# Patient Record
Sex: Female | Born: 1950 | Race: White | Hispanic: No | Marital: Single | Smoking: Current every day smoker
Health system: Southern US, Community
[De-identification: ages and names within clinical notes are randomized; demographics above are authoritative.]

## PROBLEM LIST (undated history)

## (undated) DIAGNOSIS — T7840XA Allergy, unspecified, initial encounter: Secondary | ICD-10-CM

## (undated) DIAGNOSIS — E079 Disorder of thyroid, unspecified: Secondary | ICD-10-CM

## (undated) DIAGNOSIS — I341 Nonrheumatic mitral (valve) prolapse: Secondary | ICD-10-CM

## (undated) HISTORY — DX: Disorder of thyroid, unspecified: E07.9

## (undated) HISTORY — DX: Allergy, unspecified, initial encounter: T78.40XA

---

## 1994-03-17 HISTORY — PX: PARTIAL HYSTERECTOMY: SHX80

## 1994-03-17 HISTORY — PX: ABDOMINAL HYSTERECTOMY: SHX81

## 2003-08-16 ENCOUNTER — Encounter: Admission: RE | Admit: 2003-08-16 | Discharge: 2003-08-16 | Payer: Self-pay | Admitting: Family Medicine

## 2003-08-24 ENCOUNTER — Encounter: Admission: RE | Admit: 2003-08-24 | Discharge: 2003-08-24 | Payer: Self-pay | Admitting: Family Medicine

## 2003-10-25 ENCOUNTER — Ambulatory Visit (HOSPITAL_COMMUNITY): Admission: RE | Admit: 2003-10-25 | Discharge: 2003-10-25 | Payer: Self-pay | Admitting: *Deleted

## 2005-04-29 LAB — HM COLONOSCOPY: HM COLON: NORMAL

## 2009-05-14 ENCOUNTER — Ambulatory Visit: Payer: Self-pay | Admitting: Surgery

## 2009-05-17 ENCOUNTER — Ambulatory Visit: Payer: Self-pay | Admitting: Surgery

## 2010-04-07 ENCOUNTER — Encounter: Payer: Self-pay | Admitting: Family Medicine

## 2011-10-25 ENCOUNTER — Emergency Department: Payer: Self-pay | Admitting: Emergency Medicine

## 2011-10-25 LAB — COMPREHENSIVE METABOLIC PANEL
Albumin: 3.9 g/dL (ref 3.4–5.0)
Calcium, Total: 8.8 mg/dL (ref 8.5–10.1)
Co2: 19 mmol/L — ABNORMAL LOW (ref 21–32)
EGFR (African American): 44 — ABNORMAL LOW
Glucose: 121 mg/dL — ABNORMAL HIGH (ref 65–99)
Potassium: 4 mmol/L (ref 3.5–5.1)
SGOT(AST): 24 U/L (ref 15–37)
Total Protein: 7.8 g/dL (ref 6.4–8.2)

## 2011-10-25 LAB — CBC
MCH: 33.4 pg (ref 26.0–34.0)
MCV: 95 fL (ref 80–100)
RBC: 4.76 10*6/uL (ref 3.80–5.20)
RDW: 13.6 % (ref 11.5–14.5)

## 2011-10-25 LAB — URINALYSIS, COMPLETE
Bilirubin,UR: NEGATIVE
Leukocyte Esterase: NEGATIVE
Nitrite: NEGATIVE
RBC,UR: 122 /HPF (ref 0–5)
Squamous Epithelial: 2
WBC UR: 13 /HPF (ref 0–5)

## 2011-10-26 LAB — URINE CULTURE

## 2012-08-24 ENCOUNTER — Ambulatory Visit: Payer: Self-pay | Admitting: Family Medicine

## 2014-02-08 LAB — LIPID PANEL
Cholesterol: 202 mg/dL — AB (ref 0–200)
HDL: 58 mg/dL (ref 35–70)
LDL Cholesterol: 127 mg/dL
TRIGLYCERIDES: 86 mg/dL (ref 40–160)

## 2014-03-21 ENCOUNTER — Ambulatory Visit: Payer: Self-pay | Admitting: Family Medicine

## 2014-03-21 LAB — HM MAMMOGRAPHY: HM Mammogram: NORMAL

## 2014-08-21 IMAGING — US US BREAST BILAT
1 series · 14 of 25 positions shown · non-contrast
Comparison: none

REASON FOR EXAM: RT BR NODULE 4 OCLOCK LT BR NODULE 7-8 OCLOCK
COMMENTS:

[Series 1: us breast bilat · 0.08mm/px · 14 of 50 slices shown]
[im 1/50]
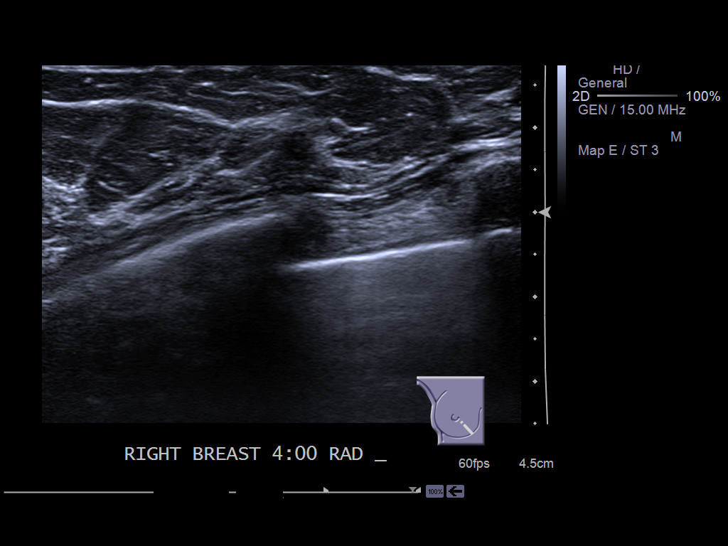
[im 5/50]
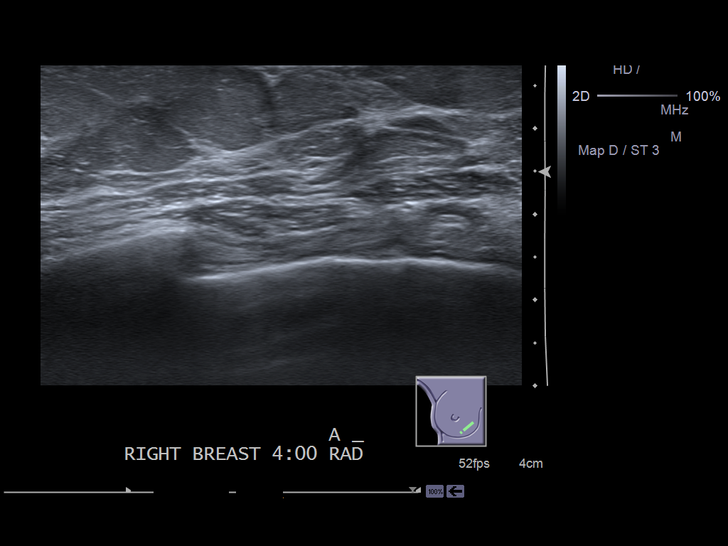
[im 9/50]
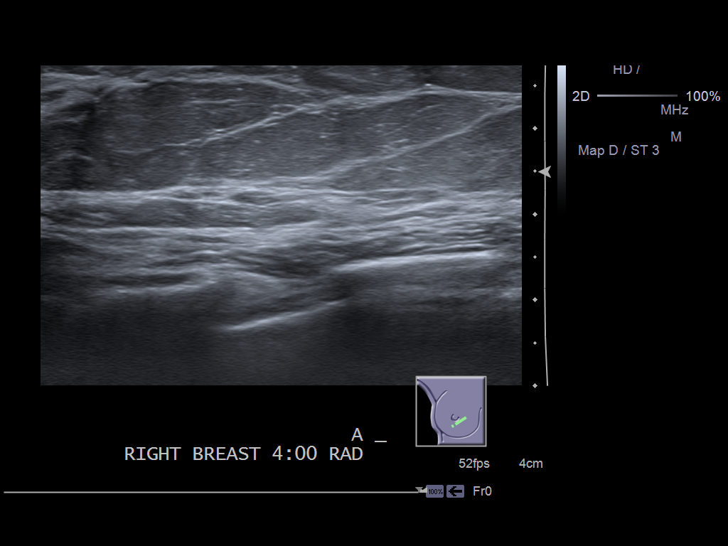
[im 13/50]
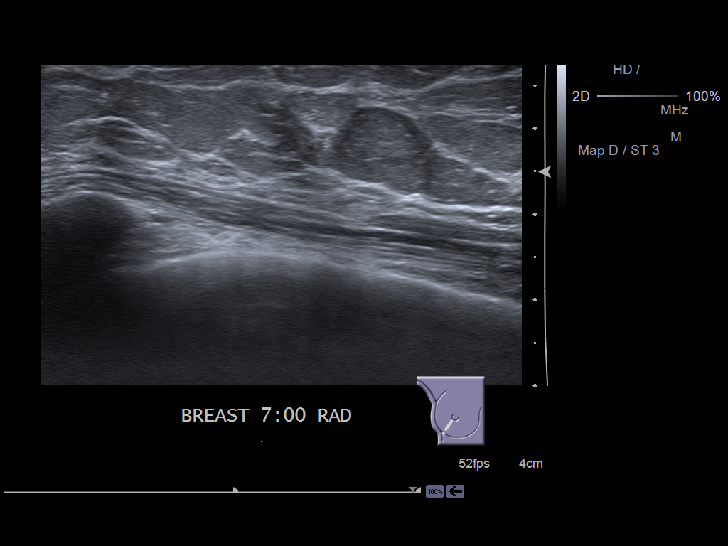
[im 17/50]
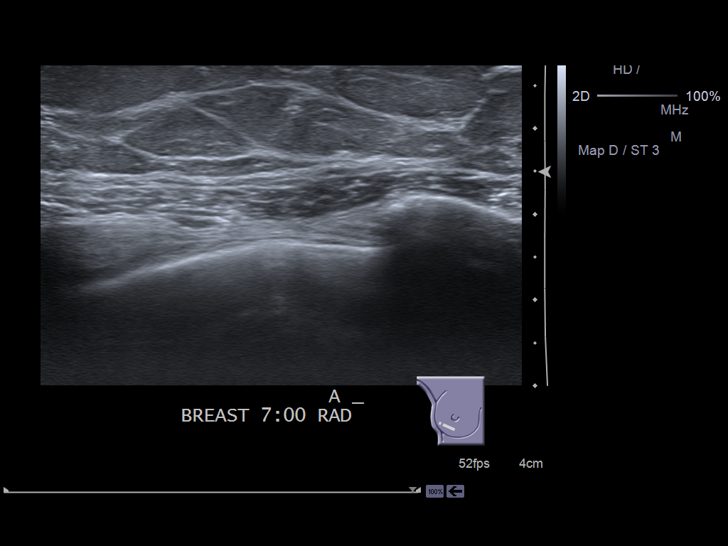
[im 19/50]
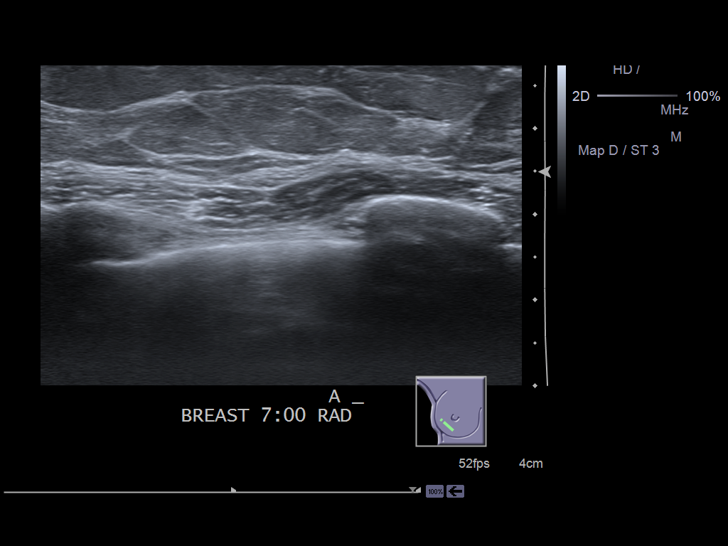
[im 23/50]
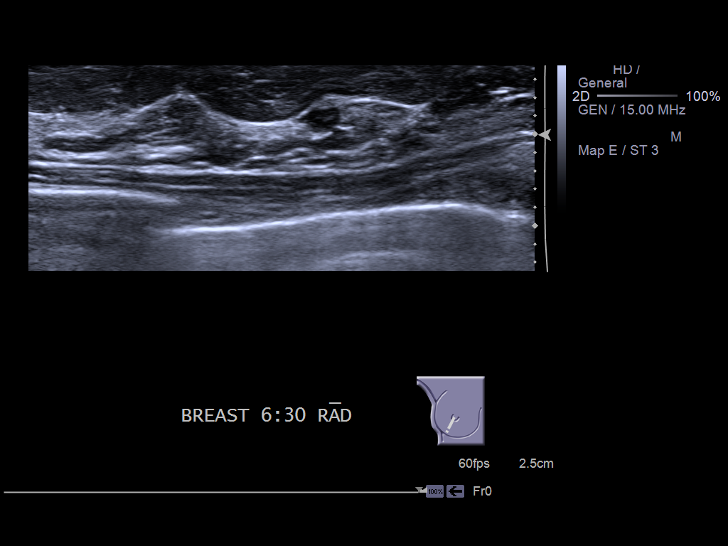
[im 27/50]
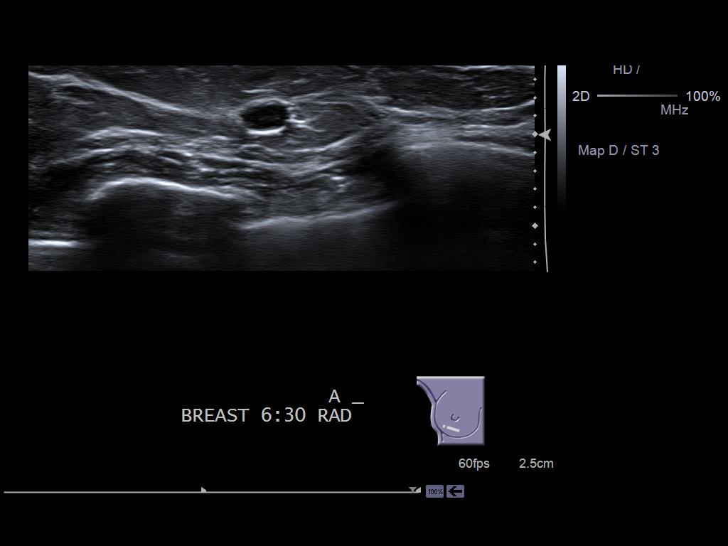
[im 31/50]
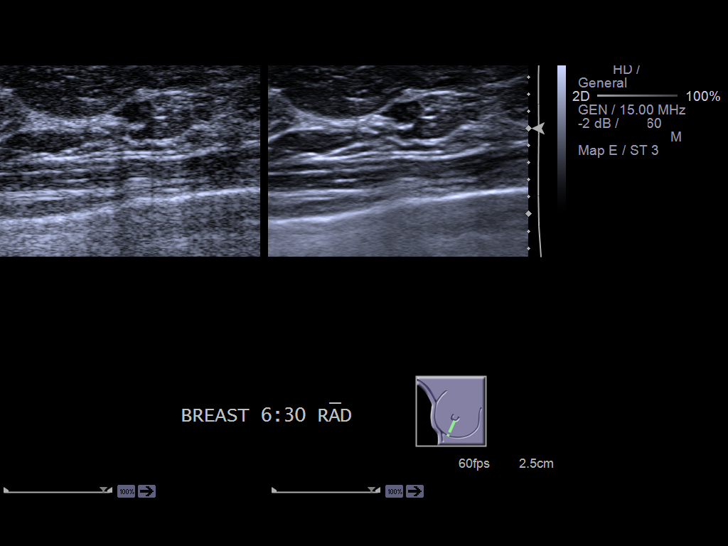
[im 33/50]
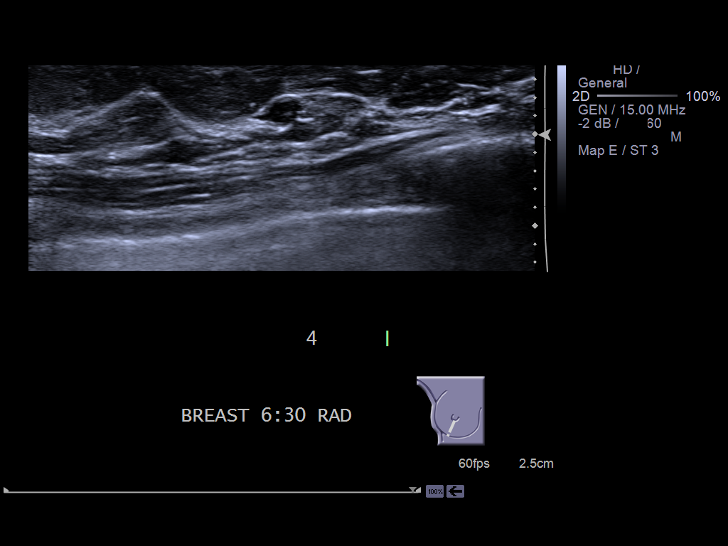
[im 37/50]
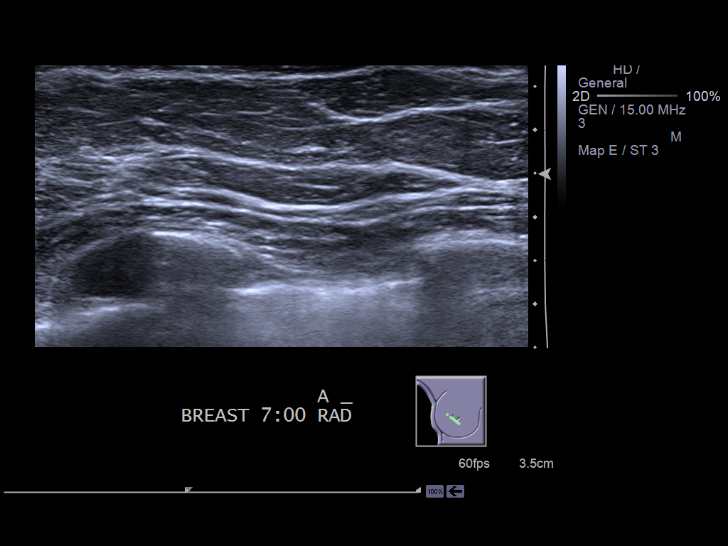
[im 41/50]
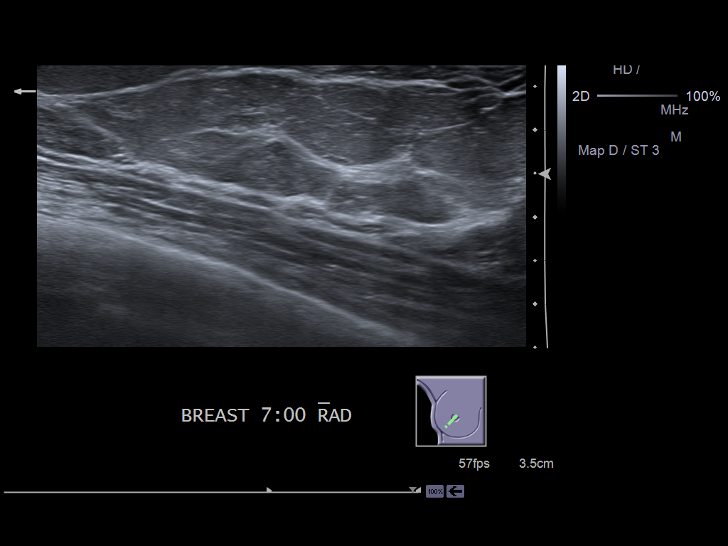
[im 45/50]
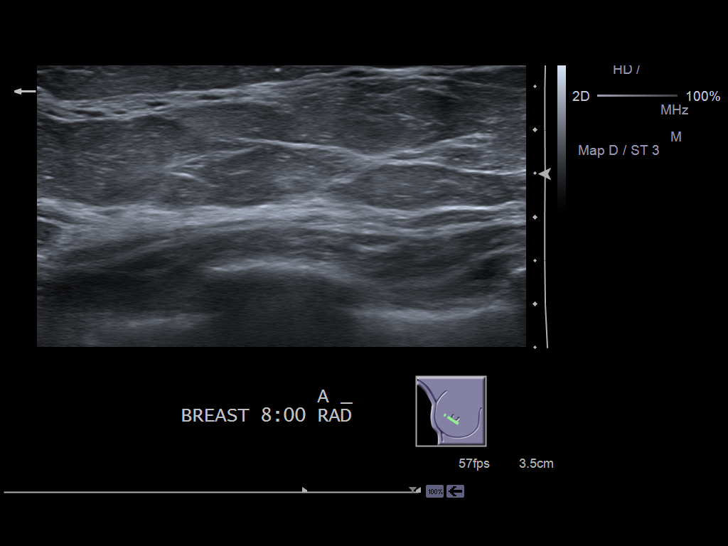
[im 50/50]
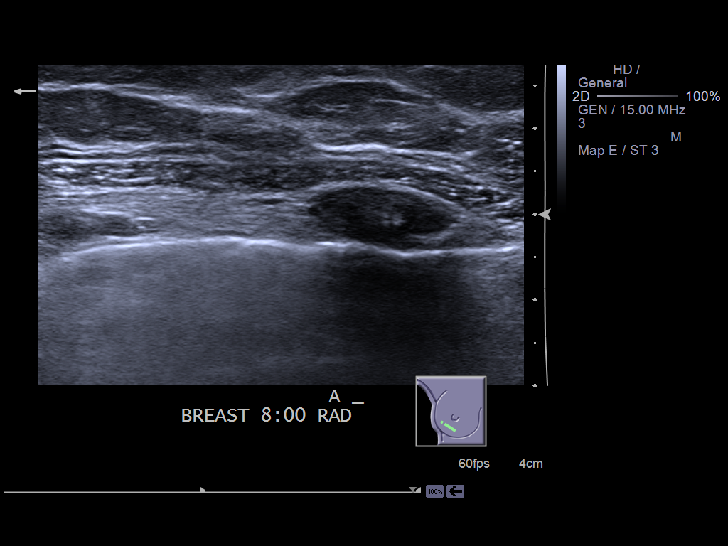

[14 of 25 positions shown; findings below may reference images not displayed]

PROCEDURE:     US  - US BREAST BILATERAL  - August 24, 2012  [DATE]

RESULT:     Ultrasound of both breasts performed, on the right at [DATE] and
on the left at [DATE] to [DATE]. Right breast is unremarkable. There is a 5 mm
hypoechoic nodule on the left which appears to contain fat within its
center. Mammogram reveals a nodular density containing fat in this region.
This is likely a benign intramammary lymph node. A repeat left breast
mammogram and ultrasound in 6 months to demonstrate stability suggested.
IMPRESSION: Nodular density in the inferior portion left breast most
likely benign intramammary lymph node. Repeat left breast mammogram and
ultrasound in 6 months to demonstrate stability suggested.

BI-RADS: Category 3 - Probably Benign Finding - Initial Short Interval
Follow - Up Suggested

A NEGATIVE MAMMOGRAM REPORT DOES NOT PRECLUDE BIOPSY OR OTHER EVALUATION OF
A CLINICALLY PALPABLE OR OTHERWISE SUSPICIOUS MASS OR LESION. BREAST CANCER
MAY NOT BE DETECTED IN UP T0 10% OF CASES.

## 2014-10-04 ENCOUNTER — Telehealth: Payer: Self-pay | Admitting: Family Medicine

## 2014-10-04 MED ORDER — LEVOTHYROXINE SODIUM 125 MCG PO TABS: 125.0000 ug | ORAL_TABLET | Freq: Every day | ORAL | Status: DC

## 2014-10-04 NOTE — Telephone Encounter (Signed)
Pt is requesting a refill on levothyroxin. She did schedule an appointment for 10-09-14. [facility]

## 2014-10-04 NOTE — Telephone Encounter (Signed)
Medication has been refilled for #30-day supply no refills sent to [facility] [address], next scheduled appointment is 10/09/2014

## 2014-10-09 ENCOUNTER — Ambulatory Visit (INDEPENDENT_AMBULATORY_CARE_PROVIDER_SITE_OTHER): Payer: 59 | Admitting: Family Medicine

## 2014-10-09 ENCOUNTER — Encounter: Payer: Self-pay | Admitting: Family Medicine

## 2014-10-09 VITALS — BP 100/70 | HR 80 | Temp 99.0°F | Resp 18 | Ht 67.0 in | Wt 140.5 lb

## 2014-10-09 DIAGNOSIS — R5383 Other fatigue: Secondary | ICD-10-CM | POA: Insufficient documentation

## 2014-10-09 DIAGNOSIS — N951 Menopausal and female climacteric states: Secondary | ICD-10-CM | POA: Insufficient documentation

## 2014-10-09 DIAGNOSIS — E039 Hypothyroidism, unspecified: Secondary | ICD-10-CM | POA: Insufficient documentation

## 2014-10-09 DIAGNOSIS — E038 Other specified hypothyroidism: Secondary | ICD-10-CM

## 2014-10-09 DIAGNOSIS — Z9189 Other specified personal risk factors, not elsewhere classified: Secondary | ICD-10-CM | POA: Insufficient documentation

## 2014-10-09 NOTE — Progress Notes (Signed)
Name: Deborah Price   MRN: [id number]    DOB: September 19, 1950   Date:10/09/2014       Progress Note  Subjective  Chief Complaint  Chief Complaint  Patient presents with  . Medication Refill    Thyroid Problem Presents for follow-up visit. Symptoms include constipation and fatigue. Patient reports no anxiety, cold intolerance, depressed mood, hair loss, leg swelling or palpitations. The symptoms have been stable. Past treatments include levothyroxine.      Past Medical History  Diagnosis Date  . Allergy   . Thyroid disease     Past Surgical History  Procedure Laterality Date  . Abdominal hysterectomy  1996    Family History  Problem Relation Age of Onset  . Hypertension Mother     History   Social History  . Marital Status: Single    Spouse Name: N/A  . Number of Children: N/A  . Years of Education: N/A   Occupational History  . Not on file.   Social History Main Topics  . Smoking status: Current Every Day Smoker -- 4.00 packs/day    Types: Cigarettes  . Smokeless tobacco: Not on file     Comment: 4 cigarettes a day  . Alcohol Use: 0.0 oz/week    0 Standard drinks or equivalent per week     Comment: Occasional  . Drug Use: No  . Sexual Activity: Not on file   Other Topics Concern  . Not on file   Social History Narrative  . No narrative on file     Current outpatient prescriptions:  .  levothyroxine (SYNTHROID, LEVOTHROID) 125 MCG tablet, Take 1 tablet (125 mcg total) by mouth daily., Disp: 30 tablet, Rfl: 0 .  MULTIPLE VITAMIN PO, Take 1 tablet by mouth daily., Disp: , Rfl:   Allergies  Allergen Reactions  . Codeine Nausea And Vomiting  . Penicillins Hives     Review of Systems  Constitutional: Positive for fatigue.  Cardiovascular: Negative for palpitations.  Gastrointestinal: Positive for constipation.  Endo/Heme/Allergies: Negative for cold intolerance.  Psychiatric/Behavioral: The patient is not nervous/anxious.        Objective  Filed Vitals:   10/09/14 1031  BP: 100/70  Pulse: 80  Temp: 99 F (37.2 C)  TempSrc: Oral  Resp: 18  Height:  (1.702 m)  Weight: 140 lb 8 oz (63.73 kg)  SpO2: 97%    Physical Exam  Constitutional: She is oriented to person, place, and time and well-developed, well-nourished, and in no distress.  HENT:  Head: Normocephalic and atraumatic.  Neck: No thyroid mass and no thyromegaly present.  Cardiovascular: Normal rate and regular rhythm.   Pulmonary/Chest: Effort normal and breath sounds normal.  Abdominal: Soft.  Neurological: She is alert and oriented to person, place, and time.  Nursing note and vitals reviewed.     Assessment & Plan 1. Other specified hypothyroidism Recheck TSH and free T4 levels and consider adjusting the medication dosage for symptoms of fatigue and constipation. - TSH - T4, free   Cyndie Woodbeck Asad A. [facility] 10/09/2014 10:46 AM

## 2014-10-10 LAB — T4, FREE: Free T4: 1.81 ng/dL — ABNORMAL HIGH (ref 0.82–1.77)

## 2014-10-10 LAB — TSH: TSH: 1.99 u[IU]/mL (ref 0.450–4.500)

## 2014-11-19 ENCOUNTER — Other Ambulatory Visit: Payer: Self-pay | Admitting: Family Medicine

## 2015-01-03 ENCOUNTER — Telehealth: Payer: Self-pay | Admitting: Family Medicine

## 2015-01-03 MED ORDER — LEVOTHYROXINE SODIUM 125 MCG PO TABS: 125.0000 ug | ORAL_TABLET | Freq: Every day | ORAL | Status: DC

## 2015-01-03 NOTE — Telephone Encounter (Signed)
Medication has been refilled and sent to [facility] [address] 

## 2015-01-03 NOTE — Telephone Encounter (Signed)
Only have one pill left of levorthyroxin and would like the refill to be sent to [facility]

## 2015-02-06 ENCOUNTER — Encounter: Payer: Self-pay | Admitting: Family Medicine

## 2015-02-06 ENCOUNTER — Ambulatory Visit (INDEPENDENT_AMBULATORY_CARE_PROVIDER_SITE_OTHER): Payer: 59 | Admitting: Family Medicine

## 2015-02-06 VITALS — BP 112/64 | HR 93 | Temp 98.6°F | Resp 16 | Ht 68.0 in | Wt 136.2 lb

## 2015-02-06 DIAGNOSIS — Z0000 Encounter for general adult medical examination without abnormal findings: Secondary | ICD-10-CM | POA: Diagnosis not present

## 2015-02-06 DIAGNOSIS — N6324 Unspecified lump in the left breast, lower inner quadrant: Secondary | ICD-10-CM

## 2015-02-06 DIAGNOSIS — N6314 Unspecified lump in the right breast, lower inner quadrant: Secondary | ICD-10-CM

## 2015-02-06 DIAGNOSIS — E039 Hypothyroidism, unspecified: Secondary | ICD-10-CM | POA: Diagnosis not present

## 2015-02-06 DIAGNOSIS — N63 Unspecified lump in unspecified breast: Secondary | ICD-10-CM | POA: Insufficient documentation

## 2015-02-06 DIAGNOSIS — Z23 Encounter for immunization: Secondary | ICD-10-CM | POA: Diagnosis not present

## 2015-02-06 MED ORDER — ZOSTER VACCINE LIVE 19400 UNT/0.65ML ~~LOC~~ SOLR: 0.6500 mL | Freq: Once | SUBCUTANEOUS | Status: DC

## 2015-02-06 MED ORDER — LEVOTHYROXINE SODIUM 125 MCG PO TABS: 125.0000 ug | ORAL_TABLET | Freq: Every day | ORAL | Status: DC

## 2015-02-06 NOTE — Progress Notes (Signed)
Name: Deborah Price   MRN: [id number]    DOB: 03/08/1951   Date:02/06/2015       Progress Note  Subjective  Chief Complaint  Chief Complaint  Patient presents with  . Annual Exam    HPI  Pt. Is here for a Complete Physical Exam.  No concerns today.  Past Medical History  Diagnosis Date  . Allergy   . Thyroid disease     Past Surgical History  Procedure Laterality Date  . Abdominal hysterectomy  1996    Family History  Problem Relation Age of Onset  . Hypertension Mother     Social History   Social History  . Marital Status: Single    Spouse Name: N/A  . Number of Children: N/A  . Years of Education: N/A   Occupational History  . Not on file.   Social History Main Topics  . Smoking status: Current Some Day Smoker -- 0.50 packs/day for 30 years    Types: Cigarettes    Start date: 02/05/1985  . Smokeless tobacco: Never Used     Comment: 4 cigarettes a day  . Alcohol Use: 0.0 oz/week    0 Standard drinks or equivalent per week     Comment: Occasional-wine on weekends  . Drug Use: No  . Sexual Activity: No   Other Topics Concern  . Not on file   Social History Narrative     Current outpatient prescriptions:  .  Cholecalciferol (VITAMIN D) 2000 UNITS CAPS, Take 1 capsule by mouth daily., Disp: , Rfl:  .  levothyroxine (SYNTHROID, LEVOTHROID) 125 MCG tablet, Take 1 tablet (125 mcg total) by mouth daily., Disp: 30 tablet, Rfl: 0 .  MULTIPLE VITAMIN PO, Take 1 tablet by mouth daily., Disp: , Rfl:   Allergies  Allergen Reactions  . Codeine Nausea And Vomiting  . Penicillins Hives     Review of Systems  Constitutional: Negative for fever, chills and weight loss.  HENT: Positive for congestion. Negative for ear pain and sore throat.   Eyes: Negative for blurred vision, double vision and pain.  Respiratory: Positive for cough and sputum production. Negative for shortness of breath.   Cardiovascular: Negative for chest pain and palpitations.   Gastrointestinal: Negative for heartburn, nausea, vomiting, abdominal pain, diarrhea, constipation, blood in stool and melena.  Genitourinary: Negative for dysuria, frequency and hematuria.  Musculoskeletal: Positive for neck pain. Negative for myalgias, back pain and joint pain.  Skin: Negative for itching and rash.  Neurological: Negative for dizziness, tingling, weakness and headaches.  Psychiatric/Behavioral: Negative for depression. The patient is not nervous/anxious and does not have insomnia.     Objective  Filed Vitals:   02/06/15 0925  BP: 112/64  Pulse: 93  Temp: 98.6 F (37 C)  TempSrc: Oral  Resp: 16  Height: 5\' 8"  (1.727 m)  Weight: 136 lb 3.2 oz (61.78 kg)  SpO2: 97%    Physical Exam  Constitutional: She is oriented to person, place, and time and well-developed, well-nourished, and in no distress.  HENT:  Head: Normocephalic and atraumatic.  Eyes: Conjunctivae are normal. Pupils are equal, round, and reactive to light.  Neck: Normal range of motion. Neck supple.  Cardiovascular: Normal rate, regular rhythm and normal heart sounds.   No murmur heard. Pulmonary/Chest: Effort normal and breath sounds normal. She has no wheezes. She has no rales. Right breast exhibits mass. Right breast exhibits no inverted nipple, no nipple discharge, no skin change and no tenderness. Left breast exhibits mass.  Left breast exhibits no inverted nipple, no nipple discharge, no skin change and no tenderness. Breasts are symmetrical.  In the right breast, area around 4 o' clock which feels different to palpation than the surrounding breast tissue. In the left breast, a small mobile, nontender mass palpated over 7:00 position.  Abdominal: Soft. Bowel sounds are normal. There is no tenderness.  Genitourinary:  Deferred. Patient follows up with GYN  Musculoskeletal: Normal range of motion.  Neurological: She is alert and oriented to person, place, and time.  Psychiatric: Memory, affect  and judgment normal.  Nursing note and vitals reviewed.    Assessment & Plan  1. Annual physical exam Obtain screening lab work including lipids, vitamin D, metabolic panel and complete blood count. - CBC with Differential - Comprehensive Metabolic Panel (CMET) - Lipid Profile - Vitamin D (25 hydroxy)  2. Hypothyroidism, unspecified hypothyroidism type We'll obtain complete thyroid panel and adjust medication as necessary. - TSH - T4, free - T3 Uptake - levothyroxine (SYNTHROID, LEVOTHROID) 125 MCG tablet; Take 1 tablet (125 mcg total) by mouth daily before breakfast.  Dispense: 90 tablet; Refill: 1  3. Need for shingles vaccine Rx provided for vaccine to be administered at the pharmacy. - zoster vaccine live, PF, (ZOSTAVAX) 45409 UNT/0.65ML injection; Inject 19,400 Units into the skin once.  Dispense: 1 each; Refill: 0  4. Breast lump on left side at 7 o'clock position We will obtain diagnostic mammogram and ultrasound for evaluation. - MM Digital Diagnostic Bilat; Future - US BREAST LTD UNI LEFT INC AXILLA; Future  5. Breast lump on right side at 4 o'clock position  - MM Digital Diagnostic Bilat; Future - US BREAST LTD UNI RIGHT INC AXILLA; Future  6. Needs flu shot  - Flu Vaccine QUAD 36+ mos PF IM (Fluarix & Fluzone Quad PF)    Burnham Trost Asad A. [facility]ay[facility] 02/06/2015 9:58 AM

## 2015-02-07 LAB — CBC WITH DIFFERENTIAL/PLATELET
BASOS ABS: 0.1 10*3/uL (ref 0.0–0.2)
Basos: 1 %
EOS (ABSOLUTE): 0.2 10*3/uL (ref 0.0–0.4)
EOS: 2 %
HEMATOCRIT: 47.5 % — AB (ref 34.0–46.6)
HEMOGLOBIN: 16.5 g/dL — AB (ref 11.1–15.9)
Immature Grans (Abs): 0 10*3/uL (ref 0.0–0.1)
Immature Granulocytes: 0 %
LYMPHS ABS: 3.5 10*3/uL — AB (ref 0.7–3.1)
Lymphs: 43 %
MCH: 32.7 pg (ref 26.6–33.0)
MCHC: 34.7 g/dL (ref 31.5–35.7)
MCV: 94 fL (ref 79–97)
MONOCYTES: 8 %
Monocytes Absolute: 0.6 10*3/uL (ref 0.1–0.9)
NEUTROS ABS: 3.8 10*3/uL (ref 1.4–7.0)
Neutrophils: 46 %
Platelets: 285 10*3/uL (ref 150–379)
RBC: 5.04 x10E6/uL (ref 3.77–5.28)
RDW: 13.9 % (ref 12.3–15.4)
WBC: 8.1 10*3/uL (ref 3.4–10.8)

## 2015-02-07 LAB — COMPREHENSIVE METABOLIC PANEL
A/G RATIO: 1.6 (ref 1.1–2.5)
ALBUMIN: 4.4 g/dL (ref 3.6–4.8)
ALK PHOS: 75 IU/L (ref 39–117)
ALT: 12 IU/L (ref 0–32)
AST: 19 IU/L (ref 0–40)
BILIRUBIN TOTAL: 0.6 mg/dL (ref 0.0–1.2)
BUN / CREAT RATIO: 18 (ref 11–26)
BUN: 17 mg/dL (ref 8–27)
CO2: 25 mmol/L (ref 18–29)
CREATININE: 0.92 mg/dL (ref 0.57–1.00)
Calcium: 9.6 mg/dL (ref 8.7–10.3)
Chloride: 100 mmol/L (ref 97–106)
GFR calc Af Amer: 77 mL/min/{1.73_m2} (ref >59)
GFR, EST NON AFRICAN AMERICAN: 67 mL/min/{1.73_m2} (ref >59)
GLOBULIN, TOTAL: 2.8 g/dL (ref 1.5–4.5)
Glucose: 89 mg/dL (ref 65–99)
Potassium: 4.9 mmol/L (ref 3.5–5.2)
SODIUM: 139 mmol/L (ref 136–144)
Total Protein: 7.2 g/dL (ref 6.0–8.5)

## 2015-02-07 LAB — LIPID PANEL
CHOL/HDL RATIO: 3.5 ratio (ref 0.0–4.4)
CHOLESTEROL TOTAL: 195 mg/dL (ref 100–199)
HDL: 56 mg/dL (ref >39)
LDL CALC: 120 mg/dL — AB (ref 0–99)
Triglycerides: 95 mg/dL (ref 0–149)
VLDL CHOLESTEROL CAL: 19 mg/dL (ref 5–40)

## 2015-02-07 LAB — T4, FREE: Free T4: 1.7 ng/dL (ref 0.82–1.77)

## 2015-02-07 LAB — T3 UPTAKE: T3 UPTAKE RATIO: 34 % (ref 24–39)

## 2015-02-07 LAB — VITAMIN D 25 HYDROXY (VIT D DEFICIENCY, FRACTURES): Vit D, 25-Hydroxy: 25.4 ng/mL — ABNORMAL LOW (ref 30.0–100.0)

## 2015-02-07 LAB — TSH: TSH: 3.05 u[IU]/mL (ref 0.450–4.500)

## 2015-02-13 ENCOUNTER — Telehealth: Payer: Self-pay | Admitting: Family Medicine

## 2015-02-13 MED ORDER — VITAMIN D (ERGOCALCIFEROL) 1.25 MG (50000 UNIT) PO CAPS: 50000.0000 [IU] | ORAL_CAPSULE | ORAL | Status: DC

## 2015-02-13 NOTE — Telephone Encounter (Signed)
Spoke with patient and lab results have been reported and [facility] 50,[facility] [address]

## 2015-02-13 NOTE — Telephone Encounter (Signed)
Patient returning your call.

## 2015-03-05 ENCOUNTER — Ambulatory Visit: Payer: Self-pay

## 2015-03-05 ENCOUNTER — Other Ambulatory Visit: Payer: Self-pay

## 2015-04-25 ENCOUNTER — Telehealth: Payer: Self-pay

## 2015-04-25 MED ORDER — MEMANTINE HCL 10 MG PO TABS: 10.0000 mg | ORAL_TABLET | Freq: Two times a day (BID) | ORAL | Status: DC

## 2015-04-25 NOTE — Telephone Encounter (Signed)
Daughter called. Pt is doing well on the trial of Namenda. However, insurnace is not going to pay for the [facility] ER, would like patient to be on memantine. memantine dosing options are  or . Pt currently on the 28 mg of Namenda ER. Pt is about ready for new RX. Please advise.   Cell [phone]

## 2015-04-25 NOTE — Telephone Encounter (Signed)
Prescribe memantine  twice daily

## 2015-08-29 ENCOUNTER — Other Ambulatory Visit: Payer: Self-pay | Admitting: Family Medicine

## 2015-09-04 ENCOUNTER — Telehealth: Payer: Self-pay | Admitting: Family Medicine

## 2015-09-04 DIAGNOSIS — E039 Hypothyroidism, unspecified: Secondary | ICD-10-CM

## 2015-09-04 MED ORDER — LEVOTHYROXINE SODIUM 125 MCG PO TABS: 125.0000 ug | ORAL_TABLET | Freq: Every day | ORAL | Status: DC

## 2015-09-04 NOTE — Telephone Encounter (Signed)
Prescription for levothyroxine has been sent to patient's pharmacy 

## 2015-09-04 NOTE — Telephone Encounter (Signed)
Patient was told by pharmacy that she would need an appointment before getting a refill on levothyroxine. She did schedule appointment for 11-06-15, she is not able to come in sooner due to work. Patient is asking that you please send in enough to last until appointment day. Please send script to [facility]

## 2015-09-05 NOTE — Telephone Encounter (Signed)
Patient verbally informed °

## 2015-11-06 ENCOUNTER — Encounter: Payer: Self-pay | Admitting: Family Medicine

## 2015-11-06 ENCOUNTER — Ambulatory Visit (INDEPENDENT_AMBULATORY_CARE_PROVIDER_SITE_OTHER): Payer: BLUE CROSS/BLUE SHIELD | Admitting: Family Medicine

## 2015-11-06 VITALS — BP 120/70 | HR 65 | Temp 98.9°F | Resp 16 | Ht 68.0 in | Wt 140.7 lb

## 2015-11-06 DIAGNOSIS — E785 Hyperlipidemia, unspecified: Secondary | ICD-10-CM | POA: Diagnosis not present

## 2015-11-06 DIAGNOSIS — E039 Hypothyroidism, unspecified: Secondary | ICD-10-CM | POA: Diagnosis not present

## 2015-11-06 DIAGNOSIS — E559 Vitamin D deficiency, unspecified: Secondary | ICD-10-CM

## 2015-11-06 LAB — LIPID PANEL
CHOL/HDL RATIO: 3 ratio (ref <=5.0)
Cholesterol: 201 mg/dL — ABNORMAL HIGH (ref 125–200)
HDL: 66 mg/dL (ref >=46)
LDL CALC: 114 mg/dL (ref <130)
TRIGLYCERIDES: 107 mg/dL (ref <150)
VLDL: 21 mg/dL (ref <30)

## 2015-11-06 MED ORDER — LEVOTHYROXINE SODIUM 125 MCG PO TABS: 125.0000 ug | ORAL_TABLET | Freq: Every day | ORAL | 1 refills | Status: DC

## 2015-11-06 NOTE — Telephone Encounter (Signed)
Written in wrong chart. Should have been under mother's chart. Judeen HammansLuvenia Gentle [birth year]. MRN [id number]

## 2015-11-06 NOTE — Progress Notes (Signed)
Name: Deborah Price   MRN: [id number]    DOB: 10/19/1950   Date:11/06/2015       Progress Note  Subjective  Chief Complaint  Chief Complaint  Patient presents with  . Hypothyroidism    Thyroid Problem  Presents for follow-up visit. Symptoms include constipation and hair loss. Patient reports no cold intolerance, depressed mood, dry skin or fatigue. The symptoms have been stable.     Past Medical History:  Diagnosis Date  . Allergy   . Thyroid disease     Past Surgical History:  Procedure Laterality Date  . ABDOMINAL HYSTERECTOMY  1996    Family History  Problem Relation Age of Onset  . Hypertension Mother     Social History   Social History  . Marital status: Single    Spouse name: N/A  . Number of children: N/A  . Years of education: N/A   Occupational History  . Not on file.   Social History Main Topics  . Smoking status: Current Some Day Smoker    Packs/day: 0.50    Years: 30.00    Types: Cigarettes    Start date: 02/05/1985  . Smokeless tobacco: Never Used     Comment: 4 cigarettes a day  . Alcohol use 0.0 oz/week     Comment: Occasional-wine on weekends  . Drug use: No  . Sexual activity: No   Other Topics Concern  . Not on file   Social History Narrative  . No narrative on file     Current Outpatient Prescriptions:  .  Cholecalciferol (VITAMIN D) 2000 UNITS CAPS, Take 1 capsule by mouth daily., Disp: , Rfl:  .  levothyroxine (SYNTHROID, LEVOTHROID) 125 MCG tablet, Take 1 tablet (125 mcg total) by mouth daily before breakfast., Disp: 60 tablet, Rfl: 0 .  MULTIPLE VITAMIN PO, Take 1 tablet by mouth daily., Disp: , Rfl:   Allergies  Allergen Reactions  . Codeine Nausea And Vomiting  . Penicillins Hives     Review of Systems  Constitutional: Negative for fatigue.  Gastrointestinal: Positive for constipation.  Endo/Heme/Allergies: Negative for cold intolerance.      Objective  Vitals:   11/06/15 1010  BP: 120/70  Pulse: 65   Resp: 16  Temp: 98.9 F (37.2 C)  TempSrc: Oral  SpO2: 93%  Weight: 140 lb 11.2 oz (63.8 kg)  Height: 5\' 8"  (1.727 m)    Physical Exam  Constitutional: She is well-developed, well-nourished, and in no distress.  HENT:  Head: Normocephalic and atraumatic.  Neck: Neck supple. No thyroid mass and no thyromegaly present.  Cardiovascular: Normal rate, regular rhythm, S1 normal, S2 normal and normal heart sounds.   No murmur heard. Pulmonary/Chest: Breath sounds normal. She has no wheezes.  Abdominal: Soft. Bowel sounds are normal. There is no tenderness. There is no rebound.  Musculoskeletal:       Right ankle: She exhibits no swelling.       Left ankle: She exhibits no swelling.  Psychiatric: Mood, memory, affect and judgment normal.     Assessment & Plan  1. Hypothyroidism, unspecified hypothyroidism type Obtain thyroid panel, refill for levothyroxine provided - TSH - T4, free - T3 Uptake - levothyroxine (SYNTHROID, LEVOTHROID) 125 MCG tablet; Take 1 tablet (125 mcg total) by mouth daily before breakfast.  Dispense: 90 tablet; Refill: 1  2. Hyperlipidemia  - Lipid Profile  3. Vitamin D deficiency  - Vitamin D (25 hydroxy)   Vikkie Goeden Asad A. [facility]aylene Kurtz[facility] 11/06/2015 10:20 AM

## 2015-11-07 LAB — VITAMIN D 25 HYDROXY (VIT D DEFICIENCY, FRACTURES): Vit D, 25-Hydroxy: 26 ng/mL — ABNORMAL LOW (ref 30–100)

## 2015-11-07 LAB — TSH: TSH: 2.74 mIU/L

## 2015-11-07 LAB — T3 UPTAKE: T3 UPTAKE: 40 % — AB (ref 22–35)

## 2015-11-07 LAB — T4, FREE: Free T4: 1.6 ng/dL (ref 0.8–1.8)

## 2015-11-09 ENCOUNTER — Telehealth: Payer: Self-pay

## 2015-11-09 MED ORDER — VITAMIN D (ERGOCALCIFEROL) 1.25 MG (50000 UNIT) PO CAPS: 50000.0000 [IU] | ORAL_CAPSULE | ORAL | 0 refills | Status: DC

## 2015-11-09 NOTE — Telephone Encounter (Signed)
Patient has been notified of lab results and a prescription for Vitamin D3 50,000 units 1 capsule once a week x12 weeks [facility] [address] [facility] Dr. Sherryll BurgerShah, patient has been notified

## 2015-11-11 ENCOUNTER — Encounter: Payer: Self-pay | Admitting: Family Medicine

## 2015-11-12 ENCOUNTER — Other Ambulatory Visit: Payer: Self-pay | Admitting: Family Medicine

## 2015-11-12 DIAGNOSIS — E559 Vitamin D deficiency, unspecified: Secondary | ICD-10-CM

## 2015-11-12 MED ORDER — VITAMIN D3 1.25 MG (50000 UT) PO TABS
1.0000 | ORAL_TABLET | ORAL | 0 refills | Status: DC
Start: 2015-11-12 — End: 2016-07-01

## 2016-03-20 ENCOUNTER — Encounter: Payer: BLUE CROSS/BLUE SHIELD | Admitting: Family Medicine

## 2016-06-25 ENCOUNTER — Telehealth: Payer: Self-pay | Admitting: Family Medicine

## 2016-06-25 NOTE — Telephone Encounter (Signed)
Have scheduled appt for 07-01-16, however she only have 5 pills left of levothyroxine. Asking that you please send enough to last untll appointment to [facility] [address] [phone]

## 2016-06-26 NOTE — Telephone Encounter (Signed)
Patient has 5 pills left. That will last her until the 17th

## 2016-06-27 NOTE — Telephone Encounter (Signed)
Patient stated that she thought she had 5 pills left of the levothyroxine but she only had 3 now she is down to 2. Patient has an appointment on 07/01/16 but won't have enough medication to last until her appointment.  [facility] to [facility] [address]

## 2016-06-30 ENCOUNTER — Other Ambulatory Visit: Payer: Self-pay | Admitting: Emergency Medicine

## 2016-06-30 MED ORDER — LEVOTHYROXINE SODIUM 125 MCG PO TABS: 125.0000 ug | ORAL_TABLET | Freq: Every day | ORAL | 0 refills | Status: DC

## 2016-06-30 NOTE — Telephone Encounter (Signed)
5 tablets sent pharmacy to last until appointment. Patient had not had any labs since August 2017.

## 2016-07-01 ENCOUNTER — Ambulatory Visit (INDEPENDENT_AMBULATORY_CARE_PROVIDER_SITE_OTHER): Payer: BLUE CROSS/BLUE SHIELD | Admitting: Family Medicine

## 2016-07-01 ENCOUNTER — Encounter: Payer: Self-pay | Admitting: Family Medicine

## 2016-07-01 VITALS — BP 120/77 | HR 75 | Temp 98.4°F | Resp 16 | Ht 68.0 in | Wt 140.5 lb

## 2016-07-01 DIAGNOSIS — E559 Vitamin D deficiency, unspecified: Secondary | ICD-10-CM | POA: Diagnosis not present

## 2016-07-01 DIAGNOSIS — E034 Atrophy of thyroid (acquired): Secondary | ICD-10-CM

## 2016-07-01 MED ORDER — LEVOTHYROXINE SODIUM 125 MCG PO TABS: 125.0000 ug | ORAL_TABLET | Freq: Every day | ORAL | 1 refills | Status: DC

## 2016-07-01 NOTE — Progress Notes (Signed)
Name: Deborah Price   MRN: [id number]    DOB: [birth year]   Date:07/01/2016       Progress Note  Subjective  Chief Complaint  Chief Complaint  Patient presents with  . Medication Refill    levothyroxine    Thyroid Problem  Presents for follow-up visit. Symptoms include hair loss. Patient reports no cold intolerance, constipation, depressed mood, dry skin, fatigue, palpitations or weight gain. The symptoms have been stable.   Patient had elevated T3 uptake, normal TSH and free T4, she is taking Levothyroxine 125 mcg every morning.   Patient had below normal Vitamin D levels at 26 ng/mL, she took 12 weeks of Vitamin D3 every week. Will repeat levels today, she denies any myalgias or arthralgias.     Past Medical History:  Diagnosis Date  . Allergy   . Thyroid disease     Past Surgical History:  Procedure Laterality Date  . ABDOMINAL HYSTERECTOMY  1996    Family History  Problem Relation Age of Onset  . Hypertension Mother     Social History   Social History  . Marital status: Single    Spouse name: N/A  . Number of children: N/A  . Years of education: N/A   Occupational History  . Not on file.   Social History Main Topics  . Smoking status: Current Some Day Smoker    Packs/day: 0.50    Years: 30.00    Types: Cigarettes    Start date: 02/05/1985  . Smokeless tobacco: Never Used     Comment: 4 cigarettes a day  . Alcohol use 0.0 oz/week     Comment: Occasional-wine on weekends  . Drug use: No  . Sexual activity: No   Other Topics Concern  . Not on file   Social History Narrative  . No narrative on file     Current Outpatient Prescriptions:  .  levothyroxine (SYNTHROID, LEVOTHROID) 125 MCG tablet, Take 1 tablet (125 mcg total) by mouth daily before breakfast., Disp: 5 tablet, Rfl: 0 .  MULTIPLE VITAMIN PO, Take 1 tablet by mouth daily., Disp: , Rfl:  .  Cholecalciferol (VITAMIN D3) 50000 units TABS, Take 1 capsule by mouth every 7 (seven) days. (Patient  not taking: Reported on 07/01/2016), Disp: 12 tablet, Rfl: 0 .  Vitamin D, Ergocalciferol, (DRISDOL) 50000 units CAPS capsule, Take 1 capsule (50,000 Units total) by mouth once a week. For 12 weeks (Patient not taking: Reported on 07/01/2016), Disp: 12 capsule, Rfl: 0  Allergies  Allergen Reactions  . Codeine Nausea And Vomiting  . Penicillins Hives     Review of Systems  Constitutional: Negative for fatigue and weight gain.  Cardiovascular: Negative for palpitations.  Gastrointestinal: Negative for constipation.  Endo/Heme/Allergies: Negative for cold intolerance.     Objective  Vitals:   07/01/16 0904  BP: 120/77  Pulse: 75  Resp: 16  Temp: 98.4 F (36.9 C)  TempSrc: Oral  SpO2: 96%  Weight: 140 lb 8 oz (63.7 kg)  Height:  (1.727 m)    Physical Exam  Constitutional: She is well-developed, well-nourished, and in no distress.  HENT:  Head: Normocephalic and atraumatic.  Neck: Neck supple. No thyroid mass and no thyromegaly present.  Cardiovascular: Normal rate, regular rhythm, S1 normal, S2 normal and normal heart sounds.   No murmur heard. Pulmonary/Chest: Breath sounds normal. She has no wheezes.  Abdominal: Soft. Bowel sounds are normal. There is no tenderness. There is no rebound.  Musculoskeletal:  Right ankle: She exhibits no swelling.       Left ankle: She exhibits no swelling.  Psychiatric: Mood, memory, affect and judgment normal.      Assessment & Plan  1.  Hypothyroidism due to acquired atrophy of thyroid Continue on levothyroxine 125 MCG every morning, obtain TSH and free T4 levels. - levothyroxine (SYNTHROID, LEVOTHROID) 125 MCG tablet; Take 1 tablet (125 mcg total) by mouth daily before breakfast.  Dispense: 90 tablet; Refill: 1 - TSH + free T4 - T3 uptake  2. Vitamin D deficiency Has finished a 3 month course of vitamin D, recheck levels today - VITAMIN D 25 Hydroxy (Vit-D Deficiency, Fractures)   Deborah Price Asad A. Faylene Kurtz  [facility] 07/01/2016 9:17 AM

## 2016-07-02 ENCOUNTER — Other Ambulatory Visit: Payer: Self-pay | Admitting: Family Medicine

## 2016-07-02 DIAGNOSIS — E559 Vitamin D deficiency, unspecified: Secondary | ICD-10-CM

## 2016-07-02 LAB — T3 UPTAKE: T3 Uptake: 37 % — ABNORMAL HIGH (ref 22–35)

## 2016-07-02 LAB — VITAMIN D 25 HYDROXY (VIT D DEFICIENCY, FRACTURES): Vit D, 25-Hydroxy: 33 ng/mL (ref 30–100)

## 2016-07-04 NOTE — Telephone Encounter (Signed)
Left a voicemail for patient instructing her to take 1,000 iu vitamin D3 once daily

## 2016-07-04 NOTE — Telephone Encounter (Signed)
Last office note and labs reviewed She does not need the Rx vitamin D any more Please instruct her to take 1,000 iu vitamin D3 once a day

## 2016-07-08 ENCOUNTER — Encounter: Payer: Self-pay | Admitting: Family Medicine

## 2016-07-08 ENCOUNTER — Ambulatory Visit (INDEPENDENT_AMBULATORY_CARE_PROVIDER_SITE_OTHER): Payer: BLUE CROSS/BLUE SHIELD | Admitting: Family Medicine

## 2016-07-08 VITALS — BP 118/68 | HR 75 | Temp 98.2°F | Resp 16 | Ht 68.0 in | Wt 140.2 lb

## 2016-07-08 DIAGNOSIS — T23161A Burn of first degree of back of right hand, initial encounter: Secondary | ICD-10-CM

## 2016-07-08 MED ORDER — DOXYCYCLINE HYCLATE 100 MG PO TABS: 100.0000 mg | ORAL_TABLET | Freq: Two times a day (BID) | ORAL | 0 refills | Status: AC

## 2016-07-08 MED ORDER — SILVER SULFADIAZINE 1 % EX CREA: 1.0000 "application " | TOPICAL_CREAM | Freq: Every day | CUTANEOUS | 0 refills | Status: DC

## 2016-07-08 NOTE — Progress Notes (Signed)
Name: Deborah Price   MRN: [id number]    DOB: 1950/10/09   Date:07/08/2016       Progress Note  Subjective  Chief Complaint  Chief Complaint  Patient presents with  . Burn    on back of right hand     Burn  The incident occurred more than 1 week ago (2 weeks ago). The burns occurred at home and in the kitchen. The burns occurred while cooking. The burns were a result of contact with a hot surface (from the oven coil). The burns are located on the right hand. The pain is at a severity of 3/10. Treatments tried: iodine solution. The treatment provided mild relief.    Past Medical History:  Diagnosis Date  . Allergy   . Thyroid disease     Past Surgical History:  Procedure Laterality Date  . ABDOMINAL HYSTERECTOMY  1996    Family History  Problem Relation Age of Onset  . Hypertension Mother     Social History   Social History  . Marital status: Single    Spouse name: N/A  . Number of children: N/A  . Years of education: N/A   Occupational History  . Not on file.   Social History Main Topics  . Smoking status: Current Some Day Smoker    Packs/day: 0.50    Years: 30.00    Types: Cigarettes    Start date: 02/05/1985  . Smokeless tobacco: Never Used     Comment: 4 cigarettes a day  . Alcohol use 0.0 oz/week     Comment: Occasional-wine on weekends  . Drug use: No  . Sexual activity: No   Other Topics Concern  . Not on file   Social History Narrative  . No narrative on file     Current Outpatient Prescriptions:  .  levothyroxine (SYNTHROID, LEVOTHROID) 125 MCG tablet, Take 1 tablet (125 mcg total) by mouth daily before breakfast., Disp: 90 tablet, Rfl: 1 .  MULTIPLE VITAMIN PO, Take 1 tablet by mouth daily., Disp: , Rfl:   Allergies  Allergen Reactions  . Codeine Nausea And Vomiting  . Penicillins Hives     Review of Systems  Constitutional: Negative for chills, fever and malaise/fatigue.  Musculoskeletal: Positive for joint pain.      Objective  Vitals:   07/08/16 1112  BP: 118/68  Pulse: 75  Resp: 16  Temp: 98.2 F (36.8 C)  TempSrc: Oral  SpO2: 96%  Weight: 140 lb 3.2 oz (63.6 kg)  Height:  (1.727 m)    Physical Exam  Constitutional: She is oriented to person, place, and time and well-developed, well-nourished, and in no distress.  Neurological: She is alert and oriented to person, place, and time.  Skin: Burn noted. There is erythema.  Erythematous tender  non draining burn wound on the dorsal surface of right hand  Psychiatric: Mood, memory, affect and judgment normal.  Nursing note and vitals reviewed.    Assessment & Plan  1. Superficial burn of back of right hand, initial encounter Tenderness and mild surrounding swelling needs to suspicion for infection, start on doxycycline, prescribed silver sulfadiazine to accelerate healing, advised to wrap with a gentle nonocclusive dressing - silver sulfADIAZINE (SILVADENE) 1 % cream; Apply 1 application topically daily.  Dispense: 25 g; Refill: 0 - doxycycline (VIBRA-TABS) 100 MG tablet; Take 1 tablet (100 mg total) by mouth 2 (two) times daily.  Dispense: 10 tablet; Refill: 0   Abbagail Scaff Asad A. [facility] 07/08/2016 11:22 AM

## 2016-07-17 ENCOUNTER — Telehealth: Payer: Self-pay | Admitting: Family Medicine

## 2016-07-17 NOTE — Telephone Encounter (Signed)
The swelling and pain should gradually improve, if she has finished the antibiotic and the silver sulfadiazine cream and her symptoms are improving, no further treatment is indicated. If the swelling does not resolve within the next 2-3 days, she should follow up for an appointment and possible referral to a specialist

## 2016-07-17 NOTE — Telephone Encounter (Signed)
Pt said that her r hand still has some swelling but the redness is not as red but still has a little soreness to it. She has finished her medication on Sunday morning. Please advise. Routed to Dr. Sherryll BurgerShah for advice

## 2016-07-17 NOTE — Telephone Encounter (Signed)
Pt said that her r hand still has some swelling but the redness is not as red but still has a little soreness to it. She has finished her medication on Sunday morning. Please advise.

## 2016-07-18 NOTE — Telephone Encounter (Signed)
Pt called checking status of the message, I read her what Dr Sherryll BurgerShah had written. Pt verbalized understanding.

## 2016-07-22 ENCOUNTER — Encounter: Payer: Self-pay | Admitting: Family Medicine

## 2016-07-22 ENCOUNTER — Ambulatory Visit (INDEPENDENT_AMBULATORY_CARE_PROVIDER_SITE_OTHER): Payer: BLUE CROSS/BLUE SHIELD | Admitting: Family Medicine

## 2016-07-22 VITALS — BP 120/67 | HR 84 | Temp 98.4°F | Resp 16 | Ht 68.0 in | Wt 140.4 lb

## 2016-07-22 DIAGNOSIS — R399 Unspecified symptoms and signs involving the genitourinary system: Secondary | ICD-10-CM | POA: Diagnosis not present

## 2016-07-22 LAB — POCT URINALYSIS DIPSTICK
Bilirubin, UA: NEGATIVE
Blood, UA: NEGATIVE
GLUCOSE UA: NEGATIVE
Ketones, UA: NEGATIVE
LEUKOCYTES UA: NEGATIVE
Nitrite, UA: NEGATIVE
PROTEIN UA: NEGATIVE
SPEC GRAV UA: 1.01 (ref 1.010–1.025)
UROBILINOGEN UA: 0.2 U/dL
pH, UA: 5 (ref 5.0–8.0)

## 2016-07-22 LAB — COMPLETE METABOLIC PANEL WITH GFR
ALBUMIN: 3.7 g/dL (ref 3.6–5.1)
ALK PHOS: 70 U/L (ref 33–130)
ALT: 7 U/L (ref 6–29)
AST: 14 U/L (ref 10–35)
BUN: 16 mg/dL (ref 7–25)
CO2: 26 mmol/L (ref 20–31)
Calcium: 9.2 mg/dL (ref 8.6–10.4)
Chloride: 105 mmol/L (ref 98–110)
Creat: 1.04 mg/dL — ABNORMAL HIGH (ref 0.50–0.99)
GFR, Est African American: 66 mL/min (ref >=60)
GFR, Est Non African American: 57 mL/min — ABNORMAL LOW (ref >=60)
GLUCOSE: 72 mg/dL (ref 65–99)
POTASSIUM: 4.3 mmol/L (ref 3.5–5.3)
SODIUM: 139 mmol/L (ref 135–146)
TOTAL PROTEIN: 6.4 g/dL (ref 6.1–8.1)
Total Bilirubin: 0.7 mg/dL (ref 0.2–1.2)

## 2016-07-22 MED ORDER — NITROFURANTOIN MONOHYD MACRO 100 MG PO CAPS: 100.0000 mg | ORAL_CAPSULE | Freq: Two times a day (BID) | ORAL | 0 refills | Status: DC

## 2016-07-22 NOTE — Progress Notes (Signed)
Name: Deborah Price   MRN: [id number]    DOB: 11/20/1950   Date:07/22/2016       Progress Note  Subjective  Chief Complaint  Chief Complaint  Patient presents with  . Urinary Tract Infection    Urinary Tract Infection   This is a new problem. The current episode started yesterday. The quality of the pain is described as aching (cramping in the lower abdomen/suprapubic area). There has been no fever. She is not sexually active. There is no history of pyelonephritis. Associated symptoms include frequency, hesitancy and urgency. Pertinent negatives include no hematuria, nausea or vomiting.     Past Medical History:  Diagnosis Date  . Allergy   . Thyroid disease     Past Surgical History:  Procedure Laterality Date  . ABDOMINAL HYSTERECTOMY  1996    Family History  Problem Relation Age of Onset  . Hypertension Mother     Social History   Social History  . Marital status: Single    Spouse name: N/A  . Number of children: N/A  . Years of education: N/A   Occupational History  . Not on file.   Social History Main Topics  . Smoking status: Current Some Day Smoker    Packs/day: 0.50    Years: 30.00    Types: Cigarettes    Start date: 02/05/1985  . Smokeless tobacco: Never Used     Comment: 4 cigarettes a day  . Alcohol use 0.0 oz/week     Comment: Occasional-wine on weekends  . Drug use: No  . Sexual activity: No   Other Topics Concern  . Not on file   Social History Narrative  . No narrative on file     Current Outpatient Prescriptions:  .  levothyroxine (SYNTHROID, LEVOTHROID) 125 MCG tablet, Take 1 tablet (125 mcg total) by mouth daily before breakfast., Disp: 90 tablet, Rfl: 1 .  MULTIPLE VITAMIN PO, Take 1 tablet by mouth daily., Disp: , Rfl:  .  silver sulfADIAZINE (SILVADENE) 1 % cream, Apply 1 application topically daily., Disp: 25 g, Rfl: 0  Allergies  Allergen Reactions  . Codeine Nausea And Vomiting  . Penicillins Hives     Review of Systems   Gastrointestinal: Negative for nausea and vomiting.  Genitourinary: Positive for frequency, hesitancy and urgency. Negative for hematuria.    Objective  Vitals:   07/22/16 1336  BP: 120/67  Pulse: 84  Resp: 16  Temp: 98.4 F (36.9 C)  TempSrc: Oral  SpO2: 96%  Weight: 140 lb 6.4 oz (63.7 kg)  Height: 5\' 8"  (1.727 m)    Physical Exam  Constitutional: She is oriented to person, place, and time and well-developed, well-nourished, and in no distress.  HENT:  Head: Normocephalic and atraumatic.  Cardiovascular: Normal rate, regular rhythm and normal heart sounds.   No murmur heard. Pulmonary/Chest: Effort normal and breath sounds normal. She has no wheezes.  Abdominal: Soft. Bowel sounds are normal. There is tenderness in the suprapubic area. There is no CVA tenderness.  Neurological: She is alert and oriented to person, place, and time.  Psychiatric: Mood, memory, affect and judgment normal.  Nursing note and vitals reviewed.      Assessment & Plan  1. UTI symptoms Based on history and presentation, started on Macrobid. Sent for urinalysis and culture - POCT Urinalysis Dipstick - Urinalysis, Routine w reflex microscopic - Urine Culture - COMPLETE METABOLIC PANEL WITH GFR - nitrofurantoin, macrocrystal-monohydrate, (MACROBID) 100 MG capsule; Take 1 capsule (100 mg total)  by mouth 2 (two) times daily.  Dispense: 14 capsule; Refill: 0   Makala Fetterolf [facility]ad [facility] 07/22/2016 1:46 PM

## 2016-07-23 ENCOUNTER — Telehealth: Payer: Self-pay | Admitting: Family Medicine

## 2016-07-23 LAB — URINALYSIS, ROUTINE W REFLEX MICROSCOPIC
Bilirubin Urine: NEGATIVE
Glucose, UA: NEGATIVE
Hgb urine dipstick: NEGATIVE
Leukocytes, UA: NEGATIVE
NITRITE: NEGATIVE
Protein, ur: NEGATIVE
SPECIFIC GRAVITY, URINE: 1.031 (ref 1.001–1.035)
pH: 5.5 (ref 5.0–8.0)

## 2016-07-23 LAB — URINALYSIS, MICROSCOPIC ONLY
BACTERIA UA: NONE SEEN [HPF]
Casts: NONE SEEN [LPF]
Yeast: NONE SEEN [HPF]

## 2016-07-23 LAB — URINE CULTURE: Organism ID, Bacteria: NO GROWTH

## 2016-07-23 MED ORDER — METRONIDAZOLE 500 MG PO TABS: 500.0000 mg | ORAL_TABLET | Freq: Two times a day (BID) | ORAL | 0 refills | Status: AC

## 2016-07-23 NOTE — Telephone Encounter (Signed)
Pt is returning your call

## 2016-07-23 NOTE — Telephone Encounter (Signed)
Spoke with patient and discussed the results of urinalysis, shows no nitrites or leukocytes, no blood. There is the presence of calcium oxalate crystals and clue cells. Patient reports that last night she felt abdominal bloating and discomfort and her fever was as high as 103F, today she feels somewhat better, urination is improved. I suspect that patient had a kidney stone that she may have passed. Other possibilities include vaginitis (bacterial vaginosis). Patient's creatinine clearance is also below 60. Will have patient stop nitrofurantoin, started on metronidazole for presumptive treatment of BV. Patient should monitor her symptoms today and return on Thursday or Friday for an appointment with one of the other providers in our office to discuss the possibility of getting an ultrasound for evaluation of possible kidney stones.

## 2016-07-29 ENCOUNTER — Encounter: Payer: Self-pay | Admitting: Family Medicine

## 2016-07-29 ENCOUNTER — Ambulatory Visit (INDEPENDENT_AMBULATORY_CARE_PROVIDER_SITE_OTHER): Payer: BLUE CROSS/BLUE SHIELD | Admitting: Family Medicine

## 2016-07-29 VITALS — BP 120/64 | HR 79 | Temp 98.5°F | Resp 16 | Ht 68.0 in | Wt 138.2 lb

## 2016-07-29 DIAGNOSIS — R102 Pelvic and perineal pain: Secondary | ICD-10-CM | POA: Diagnosis not present

## 2016-07-29 NOTE — Telephone Encounter (Signed)
Patient made appointment.

## 2016-07-29 NOTE — Progress Notes (Signed)
Name: Deborah Price   MRN: [id number]    DOB: 01/23/1951   Date:07/29/2016       Progress Note  Subjective  Chief Complaint  Chief Complaint  Patient presents with  . Follow-up    Ultrasound / CT Scan    HPI  Patient presents for concerns about pain in the suprapubic area, initially started a week ago with suprapubic pain and fever, UA at that time showed no evidence of infection and urine culture was negative. UA did show calcium oxalate crystals and clue cells, she was initially started on Macrobid, then discontinued and replaced with Metronidazole for treatment of possible BV. She reported that her symptoms subsided after being on Metronidazole but the pain returned three nights ago, described as sharp pain, worse with urination (as the pressure in the bladder increases). No blood in urine, gets somewhat better after she has finished urination.  No vaginal discharge, itching, or burning.    Past Medical History:  Diagnosis Date  . Allergy   . Thyroid disease     Past Surgical History:  Procedure Laterality Date  . ABDOMINAL HYSTERECTOMY  1996    Family History  Problem Relation Age of Onset  . Hypertension Mother     Social History   Social History  . Marital status: Single    Spouse name: N/A  . Number of children: N/A  . Years of education: N/A   Occupational History  . Not on file.   Social History Main Topics  . Smoking status: Current Some Day Smoker    Packs/day: 0.50    Years: 30.00    Types: Cigarettes    Start date: 02/05/1985  . Smokeless tobacco: Never Used     Comment: 4 cigarettes a day  . Alcohol use 0.0 oz/week     Comment: Occasional-wine on weekends  . Drug use: No  . Sexual activity: No   Other Topics Concern  . Not on file   Social History Narrative  . No narrative on file     Current Outpatient Prescriptions:  .  levothyroxine (SYNTHROID, LEVOTHROID) 125 MCG tablet, Take 1 tablet (125 mcg total) by mouth daily before breakfast.,  Disp: 90 tablet, Rfl: 1 .  metroNIDAZOLE (FLAGYL) 500 MG tablet, Take 1 tablet (500 mg total) by mouth 2 (two) times daily., Disp: 14 tablet, Rfl: 0 .  MULTIPLE VITAMIN PO, Take 1 tablet by mouth daily., Disp: , Rfl:  .  silver sulfADIAZINE (SILVADENE) 1 % cream, Apply 1 application topically daily., Disp: 25 g, Rfl: 0  Allergies  Allergen Reactions  . Codeine Nausea And Vomiting  . Penicillins Hives     ROS    Objective  Vitals:   07/29/16 1352  BP: 120/64  Pulse: 79  Resp: 16  Temp: 98.5 F (36.9 C)  TempSrc: Oral  SpO2: 96%  Weight: 138 lb 3.2 oz (62.7 kg)  Height: 5\' 8"  (1.727 m)    Physical Exam  Constitutional: She is oriented to person, place, and time and well-developed, well-nourished, and in no distress.  Abdominal: Soft. Bowel sounds are normal. There is tenderness in the suprapubic area. There is no CVA tenderness.  Neurological: She is alert and oriented to person, place, and time.  Psychiatric: Mood, memory, affect and judgment normal.  Nursing note and vitals reviewed.    Assessment & Plan  1. Suprapubic discomfort Obtain repeat urinalysis and culture to rule out an infection, ordered ultrasound to rule out kidney stone - US Renal; Future -  Urinalysis, Routine w reflex microscopic - Urine Culture   Aadvika Konen [facility] 07/29/2016 2:38 PM

## 2016-07-30 LAB — URINALYSIS, MICROSCOPIC ONLY
CASTS: NONE SEEN [LPF]
CRYSTALS: NONE SEEN [HPF]
Squamous Epithelial / LPF: NONE SEEN [HPF] (ref <=5)
WBC UA: NONE SEEN WBC/HPF (ref <=5)
YEAST: NONE SEEN [HPF]

## 2016-07-30 LAB — URINALYSIS, ROUTINE W REFLEX MICROSCOPIC
Bilirubin Urine: NEGATIVE
GLUCOSE, UA: NEGATIVE
HGB URINE DIPSTICK: NEGATIVE
KETONES UR: NEGATIVE
NITRITE: POSITIVE — AB
PH: 5 (ref 5.0–8.0)
Protein, ur: NEGATIVE
SPECIFIC GRAVITY, URINE: 1.025 (ref 1.001–1.035)

## 2016-07-30 LAB — URINE CULTURE: Organism ID, Bacteria: NO GROWTH

## 2016-07-31 ENCOUNTER — Telehealth: Payer: Self-pay | Admitting: Family Medicine

## 2016-07-31 DIAGNOSIS — N3000 Acute cystitis without hematuria: Secondary | ICD-10-CM

## 2016-07-31 DIAGNOSIS — N390 Urinary tract infection, site not specified: Secondary | ICD-10-CM | POA: Insufficient documentation

## 2016-07-31 MED ORDER — CIPROFLOXACIN HCL 500 MG PO TABS: 500.0000 mg | ORAL_TABLET | Freq: Two times a day (BID) | ORAL | 0 refills | Status: DC

## 2016-07-31 NOTE — Telephone Encounter (Signed)
Patient reports that she still has lower abdominal and pelvic pain, it did improve after she passed a large volume of urine this afternoon, I have recommended that she should have an ultrasound done tomorrow to rule out other etiologies such as kidney stone. In the meantime, based on her urinalysis and microscopic results, we'll start on ciprofloxacin  500 mg every 12 hours 7 days for treatment of UTI. Prescription will be sent to pharmacy

## 2016-07-31 NOTE — Telephone Encounter (Signed)
Pt is scheduled for some type of sonogram for possible kidney stones in her bladder. She would like to know if the symptoms are improving does she need to still do the scan. She has until tomorrow morning to cancel. If you need to speak with pt she can be reached at [phone]

## 2016-08-01 ENCOUNTER — Ambulatory Visit
Admission: RE | Admit: 2016-08-01 | Discharge: 2016-08-01 | Disposition: A | Discharge status: Home / Self Care | Payer: BLUE CROSS/BLUE SHIELD | Source: Ambulatory Visit | Attending: Family Medicine | Admitting: Family Medicine

## 2016-08-01 ENCOUNTER — Telehealth: Payer: Self-pay

## 2016-08-01 DIAGNOSIS — N200 Calculus of kidney: Secondary | ICD-10-CM | POA: Diagnosis not present

## 2016-08-01 DIAGNOSIS — N3000 Acute cystitis without hematuria: Secondary | ICD-10-CM

## 2016-08-01 DIAGNOSIS — R102 Pelvic and perineal pain: Secondary | ICD-10-CM

## 2016-08-01 MED ORDER — SULFAMETHOXAZOLE-TRIMETHOPRIM 800-160 MG PO TABS: 1.0000 | ORAL_TABLET | Freq: Two times a day (BID) | ORAL | 0 refills | Status: AC

## 2016-08-01 NOTE — Telephone Encounter (Signed)
Changed to trimethoprim/sulfamethoxazole twice daily, prescription sent to pharmacy

## 2016-08-01 NOTE — Telephone Encounter (Signed)
Patient is asking for another antibiotic due to she is unable to tolerate cipro, please send new prescription to pharmacy please

## 2016-08-01 NOTE — Telephone Encounter (Signed)
Order changed to Septra due to sensitivity to Cipro

## 2016-08-05 ENCOUNTER — Other Ambulatory Visit: Payer: Self-pay | Admitting: Family Medicine

## 2016-08-05 DIAGNOSIS — N200 Calculus of kidney: Secondary | ICD-10-CM | POA: Insufficient documentation

## 2016-08-08 ENCOUNTER — Encounter: Payer: Self-pay | Admitting: Urology

## 2016-08-08 ENCOUNTER — Ambulatory Visit: Payer: BLUE CROSS/BLUE SHIELD | Admitting: Urology

## 2016-08-08 VITALS — BP 94/63 | HR 67 | Ht 68.0 in | Wt 140.0 lb

## 2016-08-08 DIAGNOSIS — N200 Calculus of kidney: Secondary | ICD-10-CM | POA: Diagnosis not present

## 2016-08-08 LAB — URINALYSIS, COMPLETE
Bilirubin, UA: NEGATIVE
Glucose, UA: NEGATIVE
LEUKOCYTES UA: NEGATIVE
Nitrite, UA: NEGATIVE
PH UA: 5 (ref 5.0–7.5)
Specific Gravity, UA: >1.03 — ABNORMAL HIGH (ref 1.005–1.030)
Urobilinogen, Ur: 0.2 mg/dL (ref 0.2–1.0)

## 2016-08-08 LAB — MICROSCOPIC EXAMINATION
BACTERIA UA: NONE SEEN
RBC, UA: NONE SEEN /hpf (ref 0–?)
WBC UA: NONE SEEN /HPF (ref 0–?)

## 2016-08-08 NOTE — Progress Notes (Signed)
08/08/2016 10:29 AM   Deborah Price April 17, 1950 [id number]  Referring provider: Ellyn Hack, MD [address] [place], [place] [address]  Chief Complaint  Patient presents with  . Nephrolithiasis    HPI: The patient is a 66 year old female who presents for evaluation of a 5 mm possible left mid pole stone seen on renal ultrasound this performed for suprapubic discomfort. This ultrasound showed no evidence of hydronephrosis or other possible stones.  Her symptoms initially started approximately 3 weeks scope. She was having suprapubic discomfort. She is also having dysuria and urgency. At one point due to febrile 103. She has been treated twice for urinary tract infections with antibiotics to both cultures were negative. She does not frequent urinary tract infections. The aforementioned ultrasound obtained due to her history of nephrolithiasis. Her symptoms have started to resolve. She has minimal suprapubic discomfort at this point. Her dysuria has resolved. Her urgency is significantly better. She is him was back to her baseline.   PMH: Past Medical History:  Diagnosis Date  . Allergy   . Thyroid disease     Surgical History: Past Surgical History:  Procedure Laterality Date  . ABDOMINAL HYSTERECTOMY  1996    Home Medications:  Allergies as of 08/08/2016      Reactions   Ciprofloxacin Other (See Comments)   Possible tendon problems   Codeine Nausea And Vomiting   Penicillins Hives      Medication List       Accurate as of 08/08/16 10:29 AM. Always use your most recent med list.          levothyroxine 125 MCG tablet Commonly known as:  SYNTHROID, LEVOTHROID Take 1 tablet (125 mcg total) by mouth daily before breakfast.   MULTIPLE VITAMIN PO Take 1 tablet by mouth daily.   silver sulfADIAZINE 1 % cream Commonly known as:  SILVADENE Apply 1 application topically daily.   sulfamethoxazole-trimethoprim 800-160 MG tablet Commonly known as:   BACTRIM DS,SEPTRA DS Take 1 tablet by mouth 2 (two) times daily.       Allergies:  Allergies  Allergen Reactions  . Ciprofloxacin Other (See Comments)    Possible tendon problems  . Codeine Nausea And Vomiting  . Penicillins Hives    Family History: Family History  Problem Relation Age of Onset  . Hypertension Mother   . Bladder Cancer Neg Hx   . Kidney cancer Neg Hx     Social History:  reports that she has been smoking Cigarettes.  She started smoking about 31 years ago. She has a 15.00 pack-year smoking history. She has never used smokeless tobacco. She reports that she drinks alcohol. She reports that she does not use drugs.  ROS: UROLOGY Frequent Urination?: Yes Hard to postpone urination?: No Burning/pain with urination?: No Get up at night to urinate?: No Leakage of urine?: No Urine stream starts and stops?: No Trouble starting stream?: No Do you have to strain to urinate?: No Blood in urine?: No Urinary tract infection?: Yes Sexually transmitted disease?: No Injury to kidneys or bladder?: No Painful intercourse?: No Weak stream?: No Currently pregnant?: No Vaginal bleeding?: No Last menstrual period?: n  Gastrointestinal Nausea?: No Vomiting?: No Indigestion/heartburn?: No Diarrhea?: No Constipation?: Yes  Constitutional Fever: No Night sweats?: No Weight loss?: No Fatigue?: No  Skin Skin rash/lesions?: No Itching?: No  Eyes Blurred vision?: No Double vision?: No  Ears/Nose/Throat Sore throat?: No Sinus problems?: No  Hematologic/Lymphatic Swollen glands?: No Easy bruising?: No  Cardiovascular Leg swelling?: No Chest pain?: No  Respiratory Cough?: No Shortness of breath?: No  Endocrine Excessive thirst?: No  Musculoskeletal Back pain?: No Joint pain?: No  Neurological Headaches?: No Dizziness?: No  Psychologic Depression?: No Anxiety?: No  Physical Exam: BP 94/63 (BP Location: Right Arm, Patient Position:  Sitting, Cuff Size: Normal)   Pulse 67   Ht 5\' 8"  (1.727 m)   Wt 140 lb (63.5 kg)   BMI 21.29 kg/m   Constitutional:  Alert and oriented, No acute distress. HEENT: [place] AT, moist mucus membranes.  Trachea midline, no masses. Cardiovascular: No clubbing, cyanosis, or edema. Respiratory: Normal respiratory effort, no increased work of breathing. GI: Abdomen is soft, nontender, nondistended, no abdominal masses GU: No CVA tenderness.  Skin: No rashes, bruises or suspicious lesions. Lymph: No cervical or inguinal adenopathy. Neurologic: Grossly intact, no focal deficits, moving all 4 extremities. Psychiatric: Normal mood and affect.  Laboratory Data: Lab Results  Component Value Date   WBC 8.1 02/06/2015   HGB 15.9 10/25/2011   HCT 47.5 (H) 02/06/2015   MCV 94 02/06/2015   PLT 285 02/06/2015    Lab Results  Component Value Date   CREATININE 1.04 (H) 07/22/2016    No results found for: PSA  No results found for: TESTOSTERONE  No results found for: HGBA1C  Urinalysis    Component Value Date/Time   COLORURINE DARK YELLOW 07/29/2016 1450   APPEARANCEUR CLEAR 07/29/2016 1450   APPEARANCEUR Turbid 10/25/2011 0413   LABSPEC 1.025 07/29/2016 1450   LABSPEC 1.027 10/25/2011 0413   PHURINE 5.0 07/29/2016 1450   GLUCOSEU NEGATIVE 07/29/2016 1450   GLUCOSEU Negative 10/25/2011 0413   HGBUR NEGATIVE 07/29/2016 1450   BILIRUBINUR NEGATIVE 07/29/2016 1450   BILIRUBINUR neg 07/22/2016 1341   BILIRUBINUR Negative 10/25/2011 0413   KETONESUR NEGATIVE 07/29/2016 1450   PROTEINUR NEGATIVE 07/29/2016 1450   UROBILINOGEN 0.2 07/22/2016 1341   NITRITE POSITIVE (A) 07/29/2016 1450   LEUKOCYTESUR TRACE (A) 07/29/2016 1450   LEUKOCYTESUR Negative 10/25/2011 0413    Pertinent Imaging: Renal u/s reviewed as above  Assessment & Plan:    1. Possible left mid pole 5 mm nonobstructing stone I did discuss the patient the ultrasounds tend he overestimated stone presence as well as stone  size. We discussed further options to better characterize her stone including CT scan and KUB. She is not interested in undergoing a CT scan at this time as she is asymptomatic. I do not think this is unreasonable. We will have her follow up in 6 months for KUB prior.  2. Suprapubic discomfort It is unclear why the patient had sudden onset of her symptoms. I do not think it was a urinary tract infection due to her negative urine cultures. Regardless, this is improving, so it does not need further workup.   Return in about 6 months (around 02/08/2017) for KUB prior.  Hildred LaserBrian James Itzabella Sorrels, MD  [facility] [address], [address] [place], [place] [address] [phone]

## 2016-09-05 ENCOUNTER — Encounter: Payer: BLUE CROSS/BLUE SHIELD | Admitting: Family Medicine

## 2017-02-02 ENCOUNTER — Ambulatory Visit: Payer: BLUE CROSS/BLUE SHIELD | Admitting: Family Medicine

## 2017-02-26 ENCOUNTER — Ambulatory Visit: Payer: BLUE CROSS/BLUE SHIELD | Admitting: Family Medicine

## 2017-02-26 ENCOUNTER — Encounter: Payer: Self-pay | Admitting: Family Medicine

## 2017-02-26 VITALS — BP 98/60 | HR 98 | Temp 98.3°F | Resp 18 | Wt 138.7 lb

## 2017-02-26 DIAGNOSIS — E034 Atrophy of thyroid (acquired): Secondary | ICD-10-CM

## 2017-02-26 DIAGNOSIS — R252 Cramp and spasm: Secondary | ICD-10-CM

## 2017-02-26 LAB — MAGNESIUM: Magnesium: 2 mg/dL (ref 1.5–2.5)

## 2017-02-26 LAB — T3 UPTAKE: T3 UPTAKE: 36 % — AB (ref 22–35)

## 2017-02-26 LAB — COMPLETE METABOLIC PANEL WITH GFR
AG RATIO: 1.3 (calc) (ref 1.0–2.5)
ALBUMIN MSPROF: 4.2 g/dL (ref 3.6–5.1)
ALT: 9 U/L (ref 6–29)
AST: 19 U/L (ref 10–35)
Alkaline phosphatase (APISO): 72 U/L (ref 33–130)
BILIRUBIN TOTAL: 0.7 mg/dL (ref 0.2–1.2)
BUN: 18 mg/dL (ref 7–25)
CHLORIDE: 103 mmol/L (ref 98–110)
CO2: 28 mmol/L (ref 20–32)
Calcium: 9.8 mg/dL (ref 8.6–10.4)
Creat: 0.99 mg/dL (ref 0.50–0.99)
GFR, EST AFRICAN AMERICAN: 70 mL/min/{1.73_m2} (ref >=60)
GFR, Est Non African American: 60 mL/min/{1.73_m2} (ref >=60)
Globulin: 3.2 g/dL (calc) (ref 1.9–3.7)
Glucose, Bld: 94 mg/dL (ref 65–99)
POTASSIUM: 4.8 mmol/L (ref 3.5–5.3)
Sodium: 138 mmol/L (ref 135–146)
TOTAL PROTEIN: 7.4 g/dL (ref 6.1–8.1)

## 2017-02-26 LAB — T4, FREE: Free T4: 1.6 ng/dL (ref 0.8–1.8)

## 2017-02-26 LAB — TSH: TSH: 3.43 mIU/L (ref 0.40–4.50)

## 2017-02-26 MED ORDER — LEVOTHYROXINE SODIUM 125 MCG PO TABS: 125.0000 ug | ORAL_TABLET | Freq: Every day | ORAL | 1 refills | Status: DC

## 2017-02-26 NOTE — Progress Notes (Signed)
Name: Deborah Price   MRN: [id number]    DOB: 07/14/1950   Date:02/26/2017       Progress Note  Subjective  Chief Complaint  Chief Complaint  Patient presents with  . Hypothyroidism    Follow up   . Medication Refill    Thyroid Problem  Presents for follow-up visit. Patient reports no constipation, depressed mood, dry skin, fatigue, hair loss or leg swelling. The symptoms have been stable.   Pt. has noticed cramping in her bilateral feet and calf muscles on occasion, she is a Scientist, water qualityballet instructor and has noticed cramping when she comes home after instructing her students, also when she lies down in her bed. She feels well otherwise. Will like to check her electrolytes   Past Medical History:  Diagnosis Date  . Allergy   . Thyroid disease     Past Surgical History:  Procedure Laterality Date  . ABDOMINAL HYSTERECTOMY  1996    Family History  Problem Relation Age of Onset  . Hypertension Mother   . Bladder Cancer Neg Hx   . Kidney cancer Neg Hx     Social History   Socioeconomic History  . Marital status: Single    Spouse name: Not on file  . Number of children: Not on file  . Years of education: Not on file  . Highest education level: Not on file  Social Needs  . Financial resource strain: Not on file  . Food insecurity - worry: Not on file  . Food insecurity - inability: Not on file  . Transportation needs - medical: Not on file  . Transportation needs - non-medical: Not on file  Occupational History  . Not on file  Tobacco Use  . Smoking status: Current Some Day Smoker    Packs/day: 0.50    Years: 30.00    Pack years: 15.00    Types: Cigarettes    Start date: 02/05/1985  . Smokeless tobacco: Never Used  . Tobacco comment: 4 cigarettes a day  Substance and Sexual Activity  . Alcohol use: Yes    Alcohol/week: 0.0 oz    Comment: Occasional-wine on weekends  . Drug use: No  . Sexual activity: No  Other Topics Concern  . Not on file  Social History  Narrative  . Not on file     Current Outpatient Medications:  .  levothyroxine (SYNTHROID, LEVOTHROID) 125 MCG tablet, Take 1 tablet (125 mcg total) by mouth daily before breakfast., Disp: 90 tablet, Rfl: 1 .  MULTIPLE VITAMIN PO, Take 1 tablet by mouth daily., Disp: , Rfl:  .  silver sulfADIAZINE (SILVADENE) 1 % cream, Apply 1 application topically daily. (Patient not taking: Reported on 08/08/2016), Disp: 25 g, Rfl: 0  Allergies  Allergen Reactions  . Ciprofloxacin Other (See Comments)    Possible tendon problems  . Codeine Nausea And Vomiting  . Penicillins Hives     Review of Systems  Constitutional: Negative for fatigue.  Gastrointestinal: Negative for constipation.    History see history of present illness for complete discussion of ROS  Objective  Vitals:   02/26/17 1032  BP: 98/60  Pulse: 98  Resp: 18  Temp: 98.3 F (36.8 C)  TempSrc: Oral  SpO2: 97%  Weight: 138 lb 11.2 oz (62.9 kg)    Physical Exam  Constitutional: She is oriented to person, place, and time and well-developed, well-nourished, and in no distress.  HENT:  Head: Normocephalic and atraumatic.  Cardiovascular: Normal rate, regular rhythm and normal heart  sounds.  No murmur heard. Pulmonary/Chest: Effort normal and breath sounds normal. She has no wheezes.  Musculoskeletal: She exhibits no edema.       Right lower leg: She exhibits no tenderness and no edema.       Left lower leg: She exhibits no tenderness and no edema.  Normal peripheral lower extremity pulses.    Neurological: She is alert and oriented to person, place, and time.  Psychiatric: Mood, memory, affect and judgment normal.  Nursing note and vitals reviewed.     Assessment & Plan  1. Hypothyroidism due to acquired atrophy of thyroid Obtain thyroid panel, continue on levothyroxine - levothyroxine (SYNTHROID, LEVOTHROID) 125 MCG tablet; Take 1 tablet (125 mcg total) by mouth daily before breakfast.  Dispense: 90 tablet;  Refill: 1 - TSH - T4, free - T3 Uptake  2. Bilateral leg cramps We'll obtain levels of potassium and magnesium along with liver and kidney function, cramps likely because of overuse. - COMPLETE METABOLIC PANEL WITH GFR - Magnesium   [name] Asad [facility] 02/26/2017 10:39 AM

## 2017-03-05 ENCOUNTER — Ambulatory Visit: Payer: BLUE CROSS/BLUE SHIELD

## 2017-03-13 ENCOUNTER — Other Ambulatory Visit: Payer: Self-pay | Admitting: Family Medicine

## 2017-03-13 DIAGNOSIS — E034 Atrophy of thyroid (acquired): Secondary | ICD-10-CM

## 2017-03-13 DIAGNOSIS — E039 Hypothyroidism, unspecified: Secondary | ICD-10-CM

## 2017-03-13 NOTE — Telephone Encounter (Signed)
Copied from CRM [id number]. Topic: Quick Communication - Rx Refill/Question >> Mar 13, 2017 10:39 AM Leafy Roobinson, Norma J wrote: Has the patient contacted their pharmacy? yes  (Agent: If no, request that the patient contact the pharmacy for the refill.) pt needs levothyroxine 125mcg. Pt last saw dr Sherryll Burgershah on 02-26-17   Preferred Pharmacy (with phone number or street name): [facility] [address] in [place] [phone]  Agent: Please be advised that RX refills may take up to 3 business days. We ask that you follow-up with your pharmacy.

## 2017-03-16 ENCOUNTER — Telehealth: Payer: Self-pay | Admitting: Family Medicine

## 2017-03-16 NOTE — Telephone Encounter (Signed)
Copied from CRM [id number]. Topic: Quick Communication - Rx Refill/Question >> Mar 16, 2017 10:09 AM Stephannie LiSimmons, Keevin Panebianco L, NT wrote: Has the patient contacted their pharmacy? {yes  (Agent: If no, request that the patient contact the pharmacy for the refill.) Preferred Pharmacy (with phone number or street name)[facility]  [place] [address] , [phone] Agent: Please be advised that RX refills may take up to 3 business days. We ask that you follow-up with your pharmacy. Prescription for synthroid 125 mg

## 2017-03-16 NOTE — Telephone Encounter (Signed)
Medication refill. Thanks. 

## 2017-03-18 NOTE — Telephone Encounter (Signed)
[facility] [address] and she stated pt picked up her thyroid meds on 03/12/2017. Called the  PT and left a message to let her know of my findings.

## 2017-10-12 ENCOUNTER — Other Ambulatory Visit: Payer: Self-pay

## 2017-10-12 DIAGNOSIS — E034 Atrophy of thyroid (acquired): Secondary | ICD-10-CM

## 2017-10-12 MED ORDER — LEVOTHYROXINE SODIUM 125 MCG PO TABS: 125.0000 ug | ORAL_TABLET | Freq: Every day | ORAL | 1 refills | Status: DC

## 2017-10-12 NOTE — Telephone Encounter (Signed)
Last TSH and T4 reviewed; Rx approved

## 2017-11-03 ENCOUNTER — Encounter: Payer: Self-pay | Admitting: Family Medicine

## 2017-11-03 ENCOUNTER — Ambulatory Visit (INDEPENDENT_AMBULATORY_CARE_PROVIDER_SITE_OTHER): Payer: Medicare HMO | Admitting: Family Medicine

## 2017-11-03 VITALS — BP 122/74 | HR 66 | Temp 98.5°F | Resp 14 | Ht 67.13 in | Wt 142.5 lb

## 2017-11-03 DIAGNOSIS — Z23 Encounter for immunization: Secondary | ICD-10-CM | POA: Diagnosis not present

## 2017-11-03 DIAGNOSIS — Z114 Encounter for screening for human immunodeficiency virus [HIV]: Secondary | ICD-10-CM

## 2017-11-03 DIAGNOSIS — R69 Illness, unspecified: Secondary | ICD-10-CM | POA: Diagnosis not present

## 2017-11-03 DIAGNOSIS — E034 Atrophy of thyroid (acquired): Secondary | ICD-10-CM

## 2017-11-03 DIAGNOSIS — E782 Mixed hyperlipidemia: Secondary | ICD-10-CM | POA: Diagnosis not present

## 2017-11-03 DIAGNOSIS — Z1159 Encounter for screening for other viral diseases: Secondary | ICD-10-CM

## 2017-11-03 DIAGNOSIS — Z5181 Encounter for therapeutic drug level monitoring: Secondary | ICD-10-CM

## 2017-11-03 DIAGNOSIS — Z0000 Encounter for general adult medical examination without abnormal findings: Secondary | ICD-10-CM | POA: Insufficient documentation

## 2017-11-03 DIAGNOSIS — E559 Vitamin D deficiency, unspecified: Secondary | ICD-10-CM

## 2017-11-03 NOTE — Assessment & Plan Note (Signed)
Check vit D 

## 2017-11-03 NOTE — Assessment & Plan Note (Signed)
Offered and gvae PCV-13, next year will be PPSV_23

## 2017-11-03 NOTE — Progress Notes (Signed)
Patient: Deborah Price, Female    DOB: Sep 18, 1950, 67 y.o.   MRN: [id number]  Visit Date: 11/06/2017  Today's Provider: Enid Derry, MD   Chief Complaint  Patient presents with  . welcome to medicare    Subjective:   Deborah Price is a 67 y.o. female who presents today for her Subsequent Annual Wellness Visit.  USPSTF grade A and B recommendations Depression:  Depression screen [facility] 2/9 11/03/2017 02/26/2017 07/29/2016 07/22/2016 07/08/2016  Decreased Interest 0 0 0 0 0  Down, Depressed, Hopeless 0 0 0 0 0  PHQ - 2 Score 0 0 0 0 0   Hypertension: BP Readings from Last 3 Encounters:  11/03/17 122/74  02/26/17 98/60  08/08/16 94/63   Obesity: n/a Wt Readings from Last 3 Encounters:  11/03/17 142 lb 8 oz (64.6 kg)  02/26/17 138 lb 11.2 oz (62.9 kg)  08/08/16 140 lb (63.5 kg)   BMI Readings from Last 3 Encounters:  11/03/17 22.24 kg/m  02/26/17 21.09 kg/m  08/08/16 21.29 kg/m    Skin cancer: nothing worrisome, just tag on the back left side; SK on left check, tag on left side back Lung cancer:  Smoking 1 pack a week, increase over the summer, will decrease in the fall; thinking about stopping; parents are 67 and 78, caring for them, stress Breast cancer: needs mammo; no lumps; pt wants to wait for now; she'll call us, four course back to back Colorectal cancer: she wants to wait and will make a note on these Cervical cancer screening: she'll ostpone BRCA gene screening: family hx of breast and/or ovarian cancer and/or metastatic prostate cancer? no HIV, hep B, hep C: recommended STD testing and prevention (chl/gon/syphilis): n/a Intimate partner violence: no abuse Contraception: n/a Osteoporosis: DEXA, ecommended, later Fall prevention/vitamin D: discussed, used to take vit D; wants to check today Immunizations: covered Diet: pretty good Exercise: yes Alcohol: well less Tobacco use: discussed, thinking about quitting AAA: no fam hx Aspirin: recommend baby aspirin  daily for heart protection No statin, check today The 10-year ASCVD risk score Mikey Bussing DC Jr., et al., 2013) is: 8.9%   Values used to calculate the score:     Age: 67 years     Sex: Female     Is Non-Hispanic African American: No     Diabetic: No     Tobacco smoker: Yes     Systolic Blood Pressure: 423 mmHg     Is BP treated: No     HDL Cholesterol: 62 mg/dL     Total Cholesterol: 205 mg/dL  Glucose:  Glucose  Date Value Ref Range Status  10/25/2011 121 (H) 65 - 99 mg/dL Final   Glucose, Bld  Date Value Ref Range Status  11/03/2017 76 65 - 99 mg/dL Final    Comment:    .            Fasting reference interval .   02/26/2017 94 65 - 99 mg/dL Final    Comment:    .            Fasting reference interval .   07/22/2016 72 65 - 99 mg/dL Final   Lipids:  Lab Results  Component Value Date   CHOL 205 (H) 11/03/2017   CHOL 201 (H) 11/06/2015   CHOL 195 02/06/2015   Lab Results  Component Value Date   HDL 62 11/03/2017   HDL 66 11/06/2015   HDL 56 02/06/2015   Lab Results  Component Value Date  LDLCALC 126 (H) 11/03/2017   LDLCALC 114 11/06/2015   LDLCALC 120 (H) 02/06/2015   Lab Results  Component Value Date   TRIG 80 11/03/2017   TRIG 107 11/06/2015   TRIG 95 02/06/2015   Lab Results  Component Value Date   CHOLHDL 3.3 11/03/2017   CHOLHDL 3.0 11/06/2015   CHOLHDL 3.5 02/06/2015   No results found for: LDLDIRECT   Review of Systems  Past Medical History:  Diagnosis Date  . Allergy   . Thyroid disease     Past Surgical History:  Procedure Laterality Date  . ABDOMINAL HYSTERECTOMY  1996    Family History  Problem Relation Age of Onset  . Hypertension Mother   . Bladder Cancer Neg Hx   . Kidney cancer Neg Hx     Social History   Tobacco Use  . Smoking status: Current Some Day Smoker    Packs/day: 0.50    Years: 30.00    Pack years: 15.00    Types: Cigarettes    Start date: 02/05/1985  . Smokeless tobacco: Never Used  . Tobacco  comment: 4 cigarettes a day  Substance Use Topics  . Alcohol use: Yes    Alcohol/week: 0.0 standard drinks    Comment: Occasional-wine on weekends  . Drug use: No    Outpatient Encounter Medications as of 11/03/2017  Medication Sig Note  . MULTIPLE VITAMIN PO Take 1 tablet by mouth daily. 10/09/2014: Received from: [place]: Take by mouth.  . [DISCONTINUED] levothyroxine (SYNTHROID, LEVOTHROID) 125 MCG tablet Take 1 tablet (125 mcg total) by mouth daily before breakfast.   . [DISCONTINUED] silver sulfADIAZINE (SILVADENE) 1 % cream Apply 1 application topically daily. (Patient not taking: Reported on 08/08/2016)    No facility-administered encounter medications on file as of 11/03/2017.     Functional Ability / Safety Screening 1.  Was the timed Get Up and Go test less than 12 seconds?  yes 2.  Does the patient need help with the phone, transportation, shopping,      preparing meals, housework, laundry, medications, or managing money?  no 3.  Does the patient's home have:  loose throw rugs in the hallway?   no      Grab bars in the bathroom? no      Handrails on the stairs?   yes      Good lighting?   yes 4.  Has the patient noticed any hearing difficulties?   no  Advanced Directives in line for soon Does patient have a HCPOA?    no Does patient have a living will or MOST form?  no  Fall Risk Assessment See under rooming  Depression Screen See under rooming Depression screen [facility] 2/9 11/03/2017 02/26/2017 07/29/2016 07/22/2016 07/08/2016  Decreased Interest 0 0 0 0 0  Down, Depressed, Hopeless 0 0 0 0 0  PHQ - 2 Score 0 0 0 0 0    Objective:   Vitals: BP 122/74   Pulse 66   Temp 98.5 F (36.9 C) (Oral)   Resp 14   Ht 5' 7.13" (1.705 m)   Wt 142 lb 8 oz (64.6 kg)   SpO2 98%   BMI 22.24 kg/m  Body mass index is 22.24 kg/m. No exam data present  Physical Exam  Constitutional: She appears well-developed and well-nourished.  HENT:  Head:  Normocephalic and atraumatic.  Right Ear: Hearing, tympanic membrane, external ear and ear canal normal.  Left Ear: Hearing, tympanic membrane, external ear and  ear canal normal.  Eyes: Conjunctivae and EOM are normal. Right eye exhibits no hordeolum. Left eye exhibits no hordeolum. No scleral icterus.  Neck: Carotid bruit is not present. No thyromegaly present.  Cardiovascular: Normal rate, regular rhythm, S1 normal, S2 normal and normal heart sounds.  No extrasystoles are present.  Pulmonary/Chest: Effort normal and breath sounds normal. No respiratory distress.  Abdominal: Soft. Normal appearance and bowel sounds are normal. She exhibits no distension, no abdominal bruit, no pulsatile midline mass and no mass. There is no hepatosplenomegaly. There is no tenderness. No hernia.  Musculoskeletal: Normal range of motion. She exhibits no edema.  Lymphadenopathy:       Head (right side): No submandibular adenopathy present.       Head (left side): No submandibular adenopathy present.    She has no cervical adenopathy.  Neurological: She is alert. She displays no tremor. No cranial nerve deficit. She exhibits normal muscle tone. Gait normal.  Reflex Scores:      Patellar reflexes are 2+ on the right side and 2+ on the left side. Skin: Skin is warm and dry. No bruising and no ecchymosis noted. No cyanosis. No pallor.  Psychiatric: Her speech is normal and behavior is normal. Thought content normal. Her mood appears not anxious. She does not exhibit a depressed mood.   Mood/affect:  euthymic Appearance:  Casually dressed, good hygiene  6CIT Screen 11/03/2017  What Year? 0 points  What month? 0 points  What time? 0 points  Count back from 20 0 points  Months in reverse 0 points  Repeat phrase 0 points  Total Score 0    Assessment & Plan:     Annual Wellness Visit  Reviewed patient's Family Medical History Reviewed and updated list of patient's medical providers Assessment of cognitive  impairment was done Assessed patient's functional ability Established a written schedule for health screening [place] Completed and Reviewed  Exercise Activities and Dietary recommendations Goals    . Quit Smoking (pt-stated)       Immunization History  Administered Date(s) Administered  . Influenza,inj,Quad PF,6+ Mos 02/08/2014, 02/06/2015, 12/26/2016  . Influenza-Unspecified 02/08/2014, 01/04/2016  . Pneumococcal Conjugate-13 11/03/2017  . Tdap 02/08/2014  . Zoster 03/15/2015    Health Maintenance  Topic Date Due  . PAP SMEAR  02/08/2017  . INFLUENZA VACCINE  10/15/2017  . MAMMOGRAM  02/26/2018 (Originally 03/22/2015)  . COLONOSCOPY  02/26/2018 (Originally 04/30/2015)  . DEXA SCAN  11/04/2018 (Originally 08/15/2005)  . PNA vac Low Risk Adult (2 of 2 - PPSV23) 11/04/2018  . TETANUS/TDAP  02/09/2024  . Hepatitis C Screening  Completed  . HIV Screening  Completed    Discussed health benefits of physical activity, and encouraged her to engage in regular exercise appropriate for her age and condition.   No orders of the defined types were placed in this encounter.   Current Outpatient Medications:  Marland Kitchen  MULTIPLE VITAMIN PO, Take 1 tablet by mouth daily., Disp: , Rfl:  .  levothyroxine (SYNTHROID) 137 MCG tablet, Take 1 tablet (137 mcg total) by mouth daily before breakfast. (note new dose), Disp: 90 tablet, Rfl: 0 Medications Discontinued During This Encounter  Medication Reason  . silver sulfADIAZINE (SILVADENE) 1 % cream Completed Course    Next Medicare Wellness Visit in 12+ months  Problem List Items Addressed This Visit      Endocrine   Hypothyroidism   Relevant Orders   TSH (Completed)   T3, free   T4, free  Other   Hyperlipidemia   Relevant Orders   Lipid panel (Completed)   Welcome to Medicare preventive visit - Primary   Relevant Orders   EKG 12-Lead (Completed)   Vitamin D deficiency    Check vit D      Relevant Orders    VITAMIN D 25 Hydroxy (Vit-D Deficiency, Fractures) (Completed)   Need for vaccination with 13-polyvalent pneumococcal conjugate vaccine    Offered and gvae PCV-13, next year will be PPSV_23      Relevant Orders   Pneumococcal conjugate vaccine 13-valent IM (Completed)    Other Visit Diagnoses    Encounter for screening for HIV       Relevant Orders   HIV antibody (Completed)   Need for hepatitis C screening test       Relevant Orders   Hepatitis C antibody (Completed)   Medication monitoring encounter       Relevant Orders   COMPLETE METABOLIC PANEL WITH GFR (Completed)

## 2017-11-03 NOTE — Patient Instructions (Addendum)
I do encourage you to quit smoking Call [phone] to sign up for smoking cessation classes You can call 1-800-QUIT-NOW to talk with a smoking cessation coach    Steps to Quit Smoking Smoking tobacco can be bad for your health. It can also affect almost every organ in your body. Smoking puts you and people around you at risk for many serious long-lasting (chronic) diseases. Quitting smoking is hard, but it is one of the best things that you can do for your health. It is never too late to quit. What are the benefits of quitting smoking? When you quit smoking, you lower your risk for getting serious diseases and conditions. They can include:  Lung cancer or lung disease.  Heart disease.  Stroke.  Heart attack.  Not being able to have children (infertility).  Weak bones (osteoporosis) and broken bones (fractures).  If you have coughing, wheezing, and shortness of breath, those symptoms may get better when you quit. You may also get sick less often. If you are pregnant, quitting smoking can help to lower your chances of having a baby of low birth weight. What can I do to help me quit smoking? Talk with your doctor about what can help you quit smoking. Some things you can do (strategies) include:  Quitting smoking totally, instead of slowly cutting back how much you smoke over a period of time.  Going to in-person counseling. You are more likely to quit if you go to many counseling sessions.  Using resources and support systems, such as: ? Database administrator with a Social worker. ? Phone quitlines. ? Careers information officer. ? Support groups or group counseling. ? Text messaging programs. ? Mobile phone apps or applications.  Taking medicines. Some of these medicines may have nicotine in them. If you are pregnant or breastfeeding, do not take any medicines to quit smoking unless your doctor says it is okay. Talk with your doctor about counseling or other things that can help  you.  Talk with your doctor about using more than one strategy at the same time, such as taking medicines while you are also going to in-person counseling. This can help make quitting easier. What things can I do to make it easier to quit? Quitting smoking might feel very hard at first, but there is a lot that you can do to make it easier. Take these steps:  Talk to your family and friends. Ask them to support and encourage you.  Call phone quitlines, reach out to support groups, or work with a Social worker.  Ask people who smoke to not smoke around you.  Avoid places that make you want (trigger) to smoke, such as: ? Bars. ? Parties. ? Smoke-break areas at work.  Spend time with people who do not smoke.  Lower the stress in your life. Stress can make you want to smoke. Try these things to help your stress: ? Getting regular exercise. ? Deep-breathing exercises. ? Yoga. ? Meditating. ? Doing a body scan. To do this, close your eyes, focus on one area of your body at a time from head to toe, and notice which parts of your body are tense. Try to relax the muscles in those areas.  Download or buy apps on your mobile phone or tablet that can help you stick to your quit plan. There are many free apps, such as QuitGuide from the [employer] Office manager for Disease Control and Prevention). You can find more support from smokefree.gov and other websites.  This information is  not intended to replace advice given to you by your health care provider. Make sure you discuss any questions you have with your health care provider. Document Released: 12/28/2008 Document Revised: 10/30/2015 Document Reviewed: 07/18/2014 Elsevier Interactive Patient Education  2018 [place] Maintenance  Topic Date Due  . PAP SMEAR  02/08/2017  . PNA vac Low Risk Adult (1 of 2 - PCV13) 04/20/2017  . INFLUENZA VACCINE  10/15/2017  . MAMMOGRAM  02/26/2018 (Originally 03/22/2015)  . COLONOSCOPY  02/26/2018 (Originally  04/30/2015)  . Hepatitis C Screening  02/26/2018 (Originally 1950-06-27)  . HIV Screening  02/26/2018 (Originally [birth year]/1967)  . DEXA SCAN  11/04/2018 (Originally 08/15/2005)  . TETANUS/TDAP  02/09/2024   You have received the Prevnar vaccine (PCV-13) and you will not need another booster of this for the rest of your life per current ACIP guidelines   Consider getting the new shingles vaccine called Shingrix in at least one month; that is available for individuals 37 years of age and older, and is recommended even if you have had shingles in the past and/or already received the old shingles vaccine (Zostavax); it is a two-part series, and is available at many local pharmacies  Let's get labs today If you have not heard anything from my staff in a week about any orders/referrals/studies from today, please contact us here to follow-up [phone]  Please call us about the bone density and mammogram and pap smear orders/scheduling  Health Maintenance, Female Adopting a healthy lifestyle and getting preventive care can go a long way to promote health and wellness. Talk with your health care provider about what schedule of regular examinations is right for you. This is a good chance for you to check in with your provider about disease prevention and staying healthy. In between checkups, there are plenty of things you can do on your own. Experts have done a lot of research about which lifestyle changes and preventive measures are most likely to keep you healthy. Ask your health care provider for more information. Weight and diet Eat a healthy diet  Be sure to include plenty of vegetables, fruits, low-fat dairy products, and lean protein.  Do not eat a lot of foods high in solid fats, added sugars, or salt.  Get regular exercise. This is one of the most important things you can do for your health. ? Most adults should exercise for at least 150 minutes each week. The exercise should increase your  heart rate and make you sweat (moderate-intensity exercise). ? Most adults should also do strengthening exercises at least twice a week. This is in addition to the moderate-intensity exercise.  Maintain a healthy weight  Body mass index (BMI) is a measurement that can be used to identify possible weight problems. It estimates body fat based on height and weight. Your health care provider can help determine your BMI and help you achieve or maintain a healthy weight.  For females 38 years of age and older: ? A BMI below 18.5 is considered underweight. ? A BMI of 18.5 to 24.9 is normal. ? A BMI of 25 to 29.9 is considered overweight. ? A BMI of 30 and above is considered obese.  Watch levels of cholesterol and blood lipids  You should start having your blood tested for lipids and cholesterol at 65 years of age, then have this test every 5 years.  You may need to have your cholesterol levels checked more often if: ? Your lipid or cholesterol levels  are high. ? You are older than 65 years of age. ? You are at high risk for heart disease.  Cancer screening Lung Cancer  Lung cancer screening is recommended for adults 30-61 years old who are at high risk for lung cancer because of a history of smoking.  A yearly low-dose CT scan of the lungs is recommended for people who: ? Currently smoke. ? Have quit within the past 15 years. ? Have at least a 30-pack-year history of smoking. A pack year is smoking an average of one pack of cigarettes a day for 1 year.  Yearly screening should continue until it has been 15 years since you quit.  Yearly screening should stop if you develop a health problem that would prevent you from having lung cancer treatment.  Breast Cancer  Practice breast self-awareness. This means understanding how your breasts normally appear and feel.  It also means doing regular breast self-exams. Let your health care provider know about any changes, no matter how  small.  If you are in your 20s or 30s, you should have a clinical breast exam (CBE) by a health care provider every 1-3 years as part of a regular health exam.  If you are 44 or older, have a CBE every year. Also consider having a breast X-ray (mammogram) every year.  If you have a family history of breast cancer, talk to your health care provider about genetic screening.  If you are at high risk for breast cancer, talk to your health care provider about having an MRI and a mammogram every year.  Breast cancer gene (BRCA) assessment is recommended for women who have family members with BRCA-related cancers. BRCA-related cancers include: ? Breast. ? Ovarian. ? Tubal. ? Peritoneal cancers.  Results of the assessment will determine the need for genetic counseling and BRCA1 and BRCA2 testing.  Cervical Cancer Your health care provider may recommend that you be screened regularly for cancer of the pelvic organs (ovaries, uterus, and vagina). This screening involves a pelvic examination, including checking for microscopic changes to the surface of your cervix (Pap test). You may be encouraged to have this screening done every 3 years, beginning at age 84.  For women ages 90-65, health care providers may recommend pelvic exams and Pap testing every 3 years, or they may recommend the Pap and pelvic exam, combined with testing for human papilloma virus (HPV), every 5 years. Some types of HPV increase your risk of cervical cancer. Testing for HPV may also be done on women of any age with unclear Pap test results.  Other health care providers may not recommend any screening for nonpregnant women who are considered low risk for pelvic cancer and who do not have symptoms. Ask your health care provider if a screening pelvic exam is right for you.  If you have had past treatment for cervical cancer or a condition that could lead to cancer, you need Pap tests and screening for cancer for at least 20 years  after your treatment. If Pap tests have been discontinued, your risk factors (such as having a new sexual partner) need to be reassessed to determine if screening should resume. Some women have medical problems that increase the chance of getting cervical cancer. In these cases, your health care provider may recommend more frequent screening and Pap tests.  Colorectal Cancer  This type of cancer can be detected and often prevented.  Routine colorectal cancer screening usually begins at 65 years of age and continues through  65 years of age.  Your health care provider may recommend screening at an earlier age if you have risk factors for colon cancer.  Your health care provider may also recommend using home test kits to check for hidden blood in the stool.  A small camera at the end of a tube can be used to examine your colon directly (sigmoidoscopy or colonoscopy). This is done to check for the earliest forms of colorectal cancer.  Routine screening usually begins at age 31.  Direct examination of the colon should be repeated every 5-10 years through 65 years of age. However, you may need to be screened more often if early forms of precancerous polyps or small growths are found.  Skin Cancer  Check your skin from head to toe regularly.  Tell your health care provider about any new moles or changes in moles, especially if there is a change in a mole's shape or color.  Also tell your health care provider if you have a mole that is larger than the size of a pencil eraser.  Always use sunscreen. Apply sunscreen liberally and repeatedly throughout the day.  Protect yourself by wearing long sleeves, pants, a wide-brimmed hat, and sunglasses whenever you are outside.  Heart disease, diabetes, and high blood pressure  High blood pressure causes heart disease and increases the risk of stroke. High blood pressure is more likely to develop in: ? People who have blood pressure in the high end of  the normal range (130-139/85-89 mm Hg). ? People who are overweight or obese. ? People who are African American.  If you are 79-61 years of age, have your blood pressure checked every 3-5 years. If you are 40 years of age or older, have your blood pressure checked every year. You should have your blood pressure measured twice-once when you are at a hospital or clinic, and once when you are not at a hospital or clinic. Record the average of the two measurements. To check your blood pressure when you are not at a hospital or clinic, you can use: ? An automated blood pressure machine at a pharmacy. ? A home blood pressure monitor.  If you are between 70 years and 18 years old, ask your health care provider if you should take aspirin to prevent strokes.  Have regular diabetes screenings. This involves taking a blood sample to check your fasting blood sugar level. ? If you are at a normal weight and have a low risk for diabetes, have this test once every three years after 65 years of age. ? If you are overweight and have a high risk for diabetes, consider being tested at a younger age or more often. Preventing infection Hepatitis B  If you have a higher risk for hepatitis B, you should be screened for this virus. You are considered at high risk for hepatitis B if: ? You were born in a country where hepatitis B is common. Ask your health care provider which countries are considered high risk. ? Your parents were born in a high-risk country, and you have not been immunized against hepatitis B (hepatitis B vaccine). ? You have HIV or AIDS. ? You use needles to inject street drugs. ? You live with someone who has hepatitis B. ? You have had sex with someone who has hepatitis B. ? You get hemodialysis treatment. ? You take certain medicines for conditions, including cancer, organ transplantation, and autoimmune conditions.  Hepatitis C  Blood testing is recommended for: ? Everyone  born from [birth year]  through 1963. ? Anyone with known risk factors for hepatitis C.  Sexually transmitted infections (STIs)  You should be screened for sexually transmitted infections (STIs) including gonorrhea and chlamydia if: ? You are sexually active and are younger than 65 years of age. ? You are older than 65 years of age and your health care provider tells you that you are at risk for this type of infection. ? Your sexual activity has changed since you were last screened and you are at an increased risk for chlamydia or gonorrhea. Ask your health care provider if you are at risk.  If you do not have HIV, but are at risk, it may be recommended that you take a prescription medicine daily to prevent HIV infection. This is called pre-exposure prophylaxis (PrEP). You are considered at risk if: ? You are sexually active and do not regularly use condoms or know the HIV status of your partner(s). ? You take drugs by injection. ? You are sexually active with a partner who has HIV.  Talk with your health care provider about whether you are at high risk of being infected with HIV. If you choose to begin PrEP, you should first be tested for HIV. You should then be tested every 3 months for as long as you are taking PrEP. Pregnancy  If you are premenopausal and you may become pregnant, ask your health care provider about preconception counseling.  If you may become pregnant, take 400 to 800 micrograms (mcg) of folic acid every day.  If you want to prevent pregnancy, talk to your health care provider about birth control (contraception). Osteoporosis and menopause  Osteoporosis is a disease in which the bones lose minerals and strength with aging. This can result in serious bone fractures. Your risk for osteoporosis can be identified using a bone density scan.  If you are 63 years of age or older, or if you are at risk for osteoporosis and fractures, ask your health care provider if you should be screened.  Ask  your health care provider whether you should take a calcium or vitamin D supplement to lower your risk for osteoporosis.  Menopause may have certain physical symptoms and risks.  Hormone replacement therapy may reduce some of these symptoms and risks. Talk to your health care provider about whether hormone replacement therapy is right for you. Follow these instructions at home:  Schedule regular health, dental, and eye exams.  Stay current with your immunizations.  Do not use any tobacco products including cigarettes, chewing tobacco, or electronic cigarettes.  If you are pregnant, do not drink alcohol.  If you are breastfeeding, limit how much and how often you drink alcohol.  Limit alcohol intake to no more than 1 drink per day for nonpregnant women. One drink equals 12 ounces of beer, 5 ounces of wine, or 1 ounces of hard liquor.  Do not use street drugs.  Do not share needles.  Ask your health care provider for help if you need support or information about quitting drugs.  Tell your health care provider if you often feel depressed.  Tell your health care provider if you have ever been abused or do not feel safe at home. This information is not intended to replace advice given to you by your health care provider. Make sure you discuss any questions you have with your health care provider. Document Released: 09/16/2010 Document Revised: 08/09/2015 Document Reviewed: 12/05/2014 Elsevier Interactive Patient Education  [employer].

## 2017-11-04 LAB — LIPID PANEL
Cholesterol: 205 mg/dL — ABNORMAL HIGH (ref <200)
HDL: 62 mg/dL (ref >50)
LDL CHOLESTEROL (CALC): 126 mg/dL — AB
Non-HDL Cholesterol (Calc): 143 mg/dL (calc) — ABNORMAL HIGH (ref <130)
TRIGLYCERIDES: 80 mg/dL (ref <150)
Total CHOL/HDL Ratio: 3.3 (calc) (ref <5.0)

## 2017-11-04 LAB — HIV ANTIBODY (ROUTINE TESTING W REFLEX): HIV 1&2 Ab, 4th Generation: NONREACTIVE

## 2017-11-04 LAB — TSH: TSH: 10.58 m[IU]/L — AB (ref 0.40–4.50)

## 2017-11-04 LAB — COMPLETE METABOLIC PANEL WITH GFR
AG Ratio: 1.6 (calc) (ref 1.0–2.5)
ALT: 10 U/L (ref 6–29)
AST: 18 U/L (ref 10–35)
Albumin: 4.2 g/dL (ref 3.6–5.1)
Alkaline phosphatase (APISO): 70 U/L (ref 33–130)
BILIRUBIN TOTAL: 0.9 mg/dL (ref 0.2–1.2)
BUN/Creatinine Ratio: 14 (calc) (ref 6–22)
BUN: 14 mg/dL (ref 7–25)
CALCIUM: 9.3 mg/dL (ref 8.6–10.4)
CHLORIDE: 102 mmol/L (ref 98–110)
CO2: 25 mmol/L (ref 20–32)
Creat: 1.02 mg/dL — ABNORMAL HIGH (ref 0.50–0.99)
GFR, EST AFRICAN AMERICAN: 67 mL/min/{1.73_m2} (ref >=60)
GFR, EST NON AFRICAN AMERICAN: 58 mL/min/{1.73_m2} — AB (ref >=60)
Globulin: 2.6 g/dL (calc) (ref 1.9–3.7)
Glucose, Bld: 76 mg/dL (ref 65–99)
Potassium: 4.4 mmol/L (ref 3.5–5.3)
Sodium: 137 mmol/L (ref 135–146)
TOTAL PROTEIN: 6.8 g/dL (ref 6.1–8.1)

## 2017-11-04 LAB — T3, FREE: T3, Free: 2.3 pg/mL (ref 2.3–4.2)

## 2017-11-04 LAB — HEPATITIS C ANTIBODY
HEP C AB: NONREACTIVE
SIGNAL TO CUT-OFF: 0.03 (ref <1.00)

## 2017-11-04 LAB — VITAMIN D 25 HYDROXY (VIT D DEFICIENCY, FRACTURES): VIT D 25 HYDROXY: 28 ng/mL — AB (ref 30–100)

## 2017-11-04 LAB — T4, FREE: Free T4: 1.1 ng/dL (ref 0.8–1.8)

## 2017-11-05 ENCOUNTER — Encounter: Payer: Self-pay | Admitting: Family Medicine

## 2017-11-05 ENCOUNTER — Other Ambulatory Visit: Payer: Self-pay | Admitting: Family Medicine

## 2017-11-05 DIAGNOSIS — E782 Mixed hyperlipidemia: Secondary | ICD-10-CM

## 2017-11-05 DIAGNOSIS — E559 Vitamin D deficiency, unspecified: Secondary | ICD-10-CM

## 2017-11-05 DIAGNOSIS — E034 Atrophy of thyroid (acquired): Secondary | ICD-10-CM

## 2017-11-05 MED ORDER — LEVOTHYROXINE SODIUM 137 MCG PO TABS: 137.0000 ug | ORAL_TABLET | Freq: Every day | ORAL | 0 refills | Status: DC

## 2017-11-05 NOTE — Progress Notes (Signed)
Change thyroid dose Awaiting word from patient if I may start statin

## 2017-11-06 ENCOUNTER — Other Ambulatory Visit: Payer: Self-pay | Admitting: Family Medicine

## 2017-11-06 MED ORDER — LEVOTHYROXINE SODIUM 137 MCG PO TABS: 137.0000 ug | ORAL_TABLET | Freq: Every day | ORAL | 0 refills | Status: DC

## 2017-11-06 NOTE — Telephone Encounter (Signed)
Copied from CRM [id number]. Topic: Quick Communication - See Telephone Encounter >> Nov 06, 2017  8:10 AM Deborah OddiMartin, Deborah, NT wrote: CRM for notification. See Telephone encounter for: 11/06/17. Patient called and states that she needs her levothyroxine (SYNTHROID) 137 MCG tablet  rx to [facility] to [facility] . It was sent to the wrong pharmacy.  [id number] [address], [place] - [address] [phone] (Phone) [phone] (Fax)

## 2017-11-06 NOTE — Telephone Encounter (Signed)
I sent it to the pharmacy that was listed at the time the Rx was prescribed Thank you for updating her correct pharmacy

## 2017-12-24 DIAGNOSIS — R69 Illness, unspecified: Secondary | ICD-10-CM | POA: Diagnosis not present

## 2018-01-28 ENCOUNTER — Other Ambulatory Visit: Payer: Self-pay

## 2018-01-28 DIAGNOSIS — E559 Vitamin D deficiency, unspecified: Secondary | ICD-10-CM

## 2018-01-28 DIAGNOSIS — E782 Mixed hyperlipidemia: Secondary | ICD-10-CM

## 2018-01-28 DIAGNOSIS — E034 Atrophy of thyroid (acquired): Secondary | ICD-10-CM

## 2018-01-29 LAB — LIPID PANEL
Cholesterol: 193 mg/dL (ref <200)
HDL: 56 mg/dL (ref >50)
LDL Cholesterol (Calc): 118 mg/dL (calc) — ABNORMAL HIGH
Non-HDL Cholesterol (Calc): 137 mg/dL (calc) — ABNORMAL HIGH (ref <130)
Total CHOL/HDL Ratio: 3.4 (calc) (ref <5.0)
Triglycerides: 91 mg/dL (ref <150)

## 2018-01-29 LAB — TSH: TSH: 0.8 m[IU]/L (ref 0.40–4.50)

## 2018-01-29 LAB — VITAMIN D 25 HYDROXY (VIT D DEFICIENCY, FRACTURES): VIT D 25 HYDROXY: 34 ng/mL (ref 30–100)

## 2018-02-04 ENCOUNTER — Other Ambulatory Visit: Payer: Self-pay | Admitting: Family Medicine

## 2018-02-04 DIAGNOSIS — E782 Mixed hyperlipidemia: Secondary | ICD-10-CM

## 2018-02-04 MED ORDER — ATORVASTATIN CALCIUM 20 MG PO TABS: 20.0000 mg | ORAL_TABLET | Freq: Every day | ORAL | 1 refills | Status: DC

## 2018-02-04 NOTE — Progress Notes (Signed)
Start statin; recheck lipids in 6 weeks; note [facility]

## 2018-02-04 NOTE — Telephone Encounter (Signed)
Lab Results  Component Value Date   TSH 0.80 01/28/2018   Rx approved

## 2018-02-12 ENCOUNTER — Other Ambulatory Visit: Payer: Self-pay | Admitting: Family Medicine

## 2018-02-13 MED ORDER — ATORVASTATIN CALCIUM 20 MG PO TABS: ORAL_TABLET | ORAL | 0 refills | Status: DC

## 2018-02-13 NOTE — Addendum Note (Signed)
Addended by: LADA, Janit BernMELINDA P on: 02/13/2018 01:27 PM   Modules accepted: Orders

## 2018-04-30 ENCOUNTER — Ambulatory Visit: Payer: Medicare HMO | Admitting: Family Medicine

## 2018-05-07 ENCOUNTER — Ambulatory Visit (INDEPENDENT_AMBULATORY_CARE_PROVIDER_SITE_OTHER): Payer: Medicare HMO | Admitting: Nurse Practitioner

## 2018-05-07 ENCOUNTER — Encounter: Payer: Self-pay | Admitting: Nurse Practitioner

## 2018-05-07 VITALS — BP 116/72 | HR 82 | Temp 98.3°F | Resp 14 | Ht 67.0 in | Wt 139.8 lb

## 2018-05-07 DIAGNOSIS — Z1211 Encounter for screening for malignant neoplasm of colon: Secondary | ICD-10-CM | POA: Diagnosis not present

## 2018-05-07 DIAGNOSIS — R0981 Nasal congestion: Secondary | ICD-10-CM

## 2018-05-07 DIAGNOSIS — J0110 Acute frontal sinusitis, unspecified: Secondary | ICD-10-CM

## 2018-05-07 DIAGNOSIS — IMO0002 Reserved for concepts with insufficient information to code with codable children: Secondary | ICD-10-CM | POA: Insufficient documentation

## 2018-05-07 DIAGNOSIS — Z1239 Encounter for other screening for malignant neoplasm of breast: Secondary | ICD-10-CM | POA: Diagnosis not present

## 2018-05-07 MED ORDER — LORATADINE 10 MG PO TABS: 10.0000 mg | ORAL_TABLET | Freq: Every day | ORAL | 0 refills | Status: DC

## 2018-05-07 MED ORDER — DOXYCYCLINE HYCLATE 100 MG PO TABS: 100.0000 mg | ORAL_TABLET | Freq: Two times a day (BID) | ORAL | 0 refills | Status: DC

## 2018-05-07 MED ORDER — FLUTICASONE PROPIONATE 50 MCG/ACT NA SUSP: 2.0000 | Freq: Every day | NASAL | 6 refills | Status: DC

## 2018-05-07 NOTE — Patient Instructions (Addendum)
Please do call to schedule your mammogram; the number to schedule one at either [facility] or [facility] is [phone]   - Use doxycycline every 12 hours for 10 days. Please take your antibiotic as prescribed with food on your stomach to prevent nausea and upset stomach. Take the antibiotic for the entire course, even if your symptoms resolve before the course if completed. Using antibiotics inappropriately can make it harder to treat future infections. If you have any concerning side effects please stop the medication and let us know immediately. I do recommend taking a probiotic to replenish your good gut health anytime you take an antibiotic. You can get probiotics in types of yogurt like activia, or other food/drinks such as kimchi, kombucha , sauerkraut, or you can take an over the counter supplement.  - Flonase 1-2 sprays in each nostril in the morning to help with congestion as needed - once a day anti-histamine for 1 week and then just as needed for runny nose such as: loratadine, cetrizine, xyzal.  - Humidified air, vitamin C, neti pot can provide addition relief.   Sinusitis, Adult Sinusitis is soreness and swelling (inflammation) of your sinuses. Sinuses are hollow spaces in the bones around your face. They are located:  Around your eyes.  In the middle of your forehead.  Behind your nose.  In your cheekbones. Your sinuses and nasal passages are lined with a fluid called mucus. Mucus drains out of your sinuses. Swelling can trap mucus in your sinuses. This lets germs (bacteria, virus, or fungus) grow, which leads to infection. Most of the time, this condition is caused by a virus. What are the causes? This condition is caused by:  Allergies.  Asthma.  Germs.  Things that block your nose or sinuses.  Growths in the nose (nasal polyps).  Chemicals or irritants in the air.  Fungus (rare). What increases the risk? You are more likely to  develop this condition if:  You have a weak body defense system (immune system).  You do a lot of swimming or diving.  You use nasal sprays too much.  You smoke. What are the signs or symptoms? The main symptoms of this condition are pain and a feeling of pressure around the sinuses. Other symptoms include:  Stuffy nose (congestion).  Runny nose (drainage).  Swelling and warmth in the sinuses.  Headache.  Toothache.  A cough that may get worse at night.  Mucus that collects in the throat or the back of the nose (postnasal drip).  Being unable to smell and taste.  Being very tired (fatigue).  A fever.  Sore throat.  Bad breath. How is this diagnosed? This condition is diagnosed based on:  Your symptoms.  Your medical history.  A physical exam.  Tests to find out if your condition is short-term (acute) or long-term (chronic). Your doctor may: ? Check your nose for growths (polyps). ? Check your sinuses using a tool that has a light (endoscope). ? Check for allergies or germs. ? Do imaging tests, such as an MRI or CT scan. How is this treated? Treatment for this condition depends on the cause and whether it is short-term or long-term.  If caused by a virus, your symptoms should go away on their own within 10 days. You may be given medicines to relieve symptoms. They include: ? Medicines that shrink swollen tissue in the nose. ? Medicines that treat allergies (antihistamines). ? A spray that treats swelling of the nostrils. ?  Rinses that help get rid of thick mucus in your nose (nasal saline washes).  If caused by bacteria, your doctor may wait to see if you will get better without treatment. You may be given antibiotic medicine if you have: ? A very bad infection. ? A weak body defense system.  If caused by growths in the nose, you may need to have surgery. Follow these instructions at home: Medicines  Take, use, or apply over-the-counter and  prescription medicines only as told by your doctor. These may include nasal sprays.  If you were prescribed an antibiotic medicine, take it as told by your doctor. Do not stop taking the antibiotic even if you start to feel better. Hydrate and humidify   Drink enough water to keep your pee (urine) pale yellow.  Use a cool mist humidifier to keep the humidity level in your home above 50%.  Breathe in steam for 10-15 minutes, 3-4 times a day, or as told by your doctor. You can do this in the bathroom while a hot shower is running.  Try not to spend time in cool or dry air. Rest  Rest as much as you can.  Sleep with your head raised (elevated).  Make sure you get enough sleep each night. General instructions   Put a warm, moist washcloth on your face 3-4 times a day, or as often as told by your doctor. This will help with discomfort.  Wash your hands often with soap and water. If there is no soap and water, use hand sanitizer.  Do not smoke. Avoid being around people who are smoking (secondhand smoke).  Keep all follow-up visits as told by your doctor. This is important. Contact a doctor if:  You have a fever.  Your symptoms get worse.  Your symptoms do not get better within 10 days. Get help right away if:  You have a very bad headache.  You cannot stop throwing up (vomiting).  You have very bad pain or swelling around your face or eyes.  You have trouble seeing.  You feel confused.  Your neck is stiff.  You have trouble breathing. Summary  Sinusitis is swelling of your sinuses. Sinuses are hollow spaces in the bones around your face.  This condition is caused by tissues in your nose that become inflamed or swollen. This traps germs. These can lead to infection.  If you were prescribed an antibiotic medicine, take it as told by your doctor. Do not stop taking it even if you start to feel better.  Keep all follow-up visits as told by your doctor. This is  important. This information is not intended to replace advice given to you by your health care provider. Make sure you discuss any questions you have with your health care provider. Document Released: 08/20/2007 Document Revised: 08/03/2017 Document Reviewed: 08/03/2017 Elsevier Interactive Patient Education  2019 [employer].
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
## 2018-05-11 ENCOUNTER — Telehealth: Payer: Self-pay | Admitting: Gastroenterology

## 2018-05-11 NOTE — Telephone Encounter (Signed)
Pt left vm for Michelle °

## 2018-05-11 NOTE — Telephone Encounter (Signed)
Returned patients call to schedule colonoscopy.  She would like to call me back on Friday to schedule.  Thanks [employer]

## 2018-05-14 ENCOUNTER — Other Ambulatory Visit: Payer: Self-pay

## 2018-05-14 ENCOUNTER — Telehealth: Payer: Self-pay | Admitting: Gastroenterology

## 2018-05-14 DIAGNOSIS — Z1211 Encounter for screening for malignant neoplasm of colon: Secondary | ICD-10-CM

## 2018-05-14 NOTE — Telephone Encounter (Signed)
PT IS CALLING FOR MICHELLE °

## 2018-05-14 NOTE — Telephone Encounter (Signed)
Call returned.  Colonoscopy has been scheduled with Dr. Tobi Bastos on 06/03/18 at [facility].  Gastroenterology Pre-Procedure Review  Request Date: 06/03/18 Requesting Physician: Dr. Tobi Bastos  PATIENT REVIEW QUESTIONS: The patient responded to the following health history questions as indicated:    1. Are you having any GI issues? no 2. Do you have a personal history of Polyps? yes (Patient states over ten years ago) 3. Do you have a family history of Colon Cancer or Polyps? no 4. Diabetes Mellitus? no 5. Joint replacements in the past 12 months?no 6. Major health problems in the past 3 months?no 7. Any artificial heart valves, MVP, or defibrillator?no (Atrial Fib as a child)    MEDICATIONS & ALLERGIES:    Patient reports the following regarding taking any anticoagulation/antiplatelet therapy:   Plavix, Coumadin, Eliquis, Xarelto, Lovenox, Pradaxa, Brilinta, or Effient? no Aspirin? no  Patient confirms/reports the following medications:  Current Outpatient Medications  Medication Sig Dispense Refill  . acetaminophen (TYLENOL) 325 MG suppository Place 325 mg rectally every 4 (four) hours as needed.    Marland Kitchen atorvastatin (LIPITOR) 20 MG tablet TAKE 1 TABLET BY MOUTH EVERYDAY AT BEDTIME (Patient not taking: Reported on 05/07/2018) 90 tablet 0  . doxycycline (VIBRA-TABS) 100 MG tablet Take 1 tablet (100 mg total) by mouth 2 (two) times daily. 20 tablet 0  . fluticasone (FLONASE) 50 MCG/ACT nasal spray Place 2 sprays into both nostrils daily. 16 g 6  . levothyroxine (SYNTHROID, LEVOTHROID) 137 MCG tablet TAKE 1 TABLET (137 MCG TOTAL) BY MOUTH DAILY BEFORE BREAKFAST. (NOTE NEW DOSE) 90 tablet 3  . loratadine (CLARITIN) 10 MG tablet Take 1 tablet (10 mg total) by mouth daily. 30 tablet 0  . MULTIPLE VITAMIN PO Take 1 tablet by mouth daily.     No current facility-administered medications for this visit.     Patient confirms/reports the following allergies:  Allergies  Allergen Reactions  . Ciprofloxacin  Other (See Comments)    Possible tendon problems  . Codeine Nausea And Vomiting  . Penicillins Hives    No orders of the defined types were placed in this encounter.   AUTHORIZATION INFORMATION Primary Insurance: 1D#: Group #:  Secondary Insurance: 1D#: Group #:  SCHEDULE INFORMATION: Date: 06/03/18 Time:TBD Location:[facility]

## 2018-05-21 ENCOUNTER — Ambulatory Visit (INDEPENDENT_AMBULATORY_CARE_PROVIDER_SITE_OTHER): Payer: Medicare HMO | Admitting: Family Medicine

## 2018-05-21 ENCOUNTER — Telehealth: Payer: Self-pay

## 2018-05-21 ENCOUNTER — Encounter: Payer: Self-pay | Admitting: Family Medicine

## 2018-05-21 VITALS — BP 118/70 | HR 80 | Temp 98.1°F | Resp 14 | Ht 67.0 in | Wt 139.4 lb

## 2018-05-21 DIAGNOSIS — E782 Mixed hyperlipidemia: Secondary | ICD-10-CM | POA: Diagnosis not present

## 2018-05-21 DIAGNOSIS — J328 Other chronic sinusitis: Secondary | ICD-10-CM

## 2018-05-21 DIAGNOSIS — R05 Cough: Secondary | ICD-10-CM | POA: Diagnosis not present

## 2018-05-21 DIAGNOSIS — H0520 Unspecified exophthalmos: Secondary | ICD-10-CM

## 2018-05-21 DIAGNOSIS — Z720 Tobacco use: Secondary | ICD-10-CM

## 2018-05-21 DIAGNOSIS — F17200 Nicotine dependence, unspecified, uncomplicated: Secondary | ICD-10-CM | POA: Insufficient documentation

## 2018-05-21 DIAGNOSIS — R059 Cough, unspecified: Secondary | ICD-10-CM

## 2018-05-21 DIAGNOSIS — F17210 Nicotine dependence, cigarettes, uncomplicated: Secondary | ICD-10-CM | POA: Insufficient documentation

## 2018-05-21 MED ORDER — CLINDAMYCIN HCL 300 MG PO CAPS: 300.0000 mg | ORAL_CAPSULE | Freq: Three times a day (TID) | ORAL | 0 refills | Status: DC

## 2018-05-21 NOTE — Assessment & Plan Note (Addendum)
Patient does not want to take statin; her choice; I explained that I am not a fan of prescription medicines either, but I'm not a fan or heart attacks or strokes; statin declined; encouraged healthy eating; she opts to recheck lipids in 3 months

## 2018-05-21 NOTE — Telephone Encounter (Signed)
Left detailed voicemail

## 2018-05-21 NOTE — Telephone Encounter (Signed)
I sent in antibiotic since she would not see ENT today Have her see them please next week Go to ER over weekend if worsening

## 2018-05-21 NOTE — Progress Notes (Signed)
BP 118/70   Pulse 80   Temp 98.1 F (36.7 C) (Oral)   Resp 14   Ht  (1.702 m)   Wt 139 lb 6.4 oz (63.2 kg)   SpO2 98%   BMI 21.83 kg/m    Subjective:    Patient ID: Deborah Price, female    DOB: 1951-01-15, 68 y.o.   MRN: [id number]  HPI: Deborah Price is a 68 y.o. female  Chief Complaint  Patient presents with  . Follow-up    finnished antibiotic still having cough with congestion and headache    HPI She lost her father in December Got stressed and sick Congestion just stays with her; hoping it would resolve and go away Came to the office 2 weeks ago She does not think she has ever taken doxy before; with the antibiotics, kind of waxed and wane Five days ago, really started to worsen; congestion worsened and chest cough was there; the "chest cough" has gotten a little better over the last couple of days Having headaches too, left side is worse than the right; pressure in the cheeks and around the brow Ears are okay Fuzzy head feeling Finished doxy on Tuesday Cough, smoker; no chest heaviness, no trouble breathing; cough is getting better; it's the head that is worse; patient declined chest xray Not taking cholesterol med, wanted to see if could control with diet and exercise; her preference   Depression screen [facility] 2/9 05/07/2018 11/03/2017 02/26/2017 07/29/2016 07/22/2016  Decreased Interest 0 0 0 0 0  Down, Depressed, Hopeless 0 0 0 0 0  PHQ - 2 Score 0 0 0 0 0   Fall Risk  05/07/2018 11/03/2017 02/26/2017 07/29/2016 07/22/2016  Falls in the past year? 0 No No No No  Number falls in past yr: 0 - - - -  Injury with Fall? 0 - - - -    Relevant past medical, surgical, family and social history reviewed Past Medical History:  Diagnosis Date  . Allergy   . Thyroid disease    Past Surgical History:  Procedure Laterality Date  . ABDOMINAL HYSTERECTOMY  1996   Family History  Problem Relation Age of Onset  . Hypertension Mother   . Bladder Cancer Neg Hx   .  Kidney cancer Neg Hx    Social History   Tobacco Use  . Smoking status: Current Some Day Smoker    Packs/day: 0.50    Years: 30.00    Pack years: 15.00    Types: Cigarettes    Start date: 02/05/1985  . Smokeless tobacco: Never Used  . Tobacco comment: 4 cigarettes a day  Substance Use Topics  . Alcohol use: Yes    Alcohol/week: 0.0 standard drinks    Comment: Occasional-wine on weekends  . Drug use: No     Office Visit from 05/07/2018 in [facility]  AUDIT-C Score  1      Interim medical history since last visit reviewed. Allergies and medications reviewed  Review of Systems Per HPI unless specifically indicated above     Objective:    BP 118/70   Pulse 80   Temp 98.1 F (36.7 C) (Oral)   Resp 14   Ht  (1.702 m)   Wt 139 lb 6.4 oz (63.2 kg)   SpO2 98%   BMI 21.83 kg/m   Wt Readings from Last 3 Encounters:  05/21/18 139 lb 6.4 oz (63.2 kg)  05/07/18 139 lb 12.8 oz (63.4 kg)  11/03/17 142 lb 8 oz (64.6 kg)    Physical Exam Constitutional:      Appearance: She is well-developed.  HENT:     Right Ear: Tympanic membrane and ear canal normal. No drainage. No middle ear effusion. Tympanic membrane is not erythematous.     Left Ear: Tympanic membrane normal. No drainage.  No middle ear effusion. Tympanic membrane is not erythematous.     Nose: Mucosal edema, congestion and rhinorrhea present.     Right Turbinates: Enlarged.     Left Turbinates: Enlarged.     Right Sinus: No maxillary sinus tenderness or frontal sinus tenderness.     Left Sinus: No maxillary sinus tenderness or frontal sinus tenderness.     Mouth/Throat:     Pharynx: No oropharyngeal exudate or posterior oropharyngeal erythema.  Eyes:     General: No scleral icterus.       Right eye: No discharge.        Left eye: No discharge.     Extraocular Movements:     Right eye: Normal extraocular motion.     Left eye: Normal extraocular motion.     Conjunctiva/sclera:      Right eye: Right conjunctiva is not injected.     Left eye: Left conjunctiva is not injected.     Comments: Much more prominent upper eyelid OS than right; patient agreed but not sure if that was new; no erythema however  Cardiovascular:     Rate and Rhythm: Normal rate and regular rhythm.  Pulmonary:     Effort: Pulmonary effort is normal.     Breath sounds: Normal breath sounds.  Lymphadenopathy:     Cervical: No cervical adenopathy.  Psychiatric:        Behavior: Behavior normal.     Results for orders placed or performed in visit on 01/28/18  TSH  Result Value Ref Range   TSH 0.80 0.40 - 4.50 mIU/L  Lipid panel  Result Value Ref Range   Cholesterol 193 <200 mg/dL   HDL 56 >59 mg/dL   Triglycerides 91 <563 mg/dL   LDL Cholesterol (Calc) 118 (H) mg/dL (calc)   Total CHOL/HDL Ratio 3.4 <5.0 (calc)   Non-HDL Cholesterol (Calc) 137 (H) <130 mg/dL (calc)  VITAMIN D 25 Hydroxy (Vit-D Deficiency, Fractures)  Result Value Ref Range   Vit D, 25-Hydroxy 34 30 - 100 ng/mL      Assessment & Plan:   Problem List Items Addressed This Visit      Other   Tobacco abuse    Encouraged cessation      Hyperlipidemia    Patient does not want to take statin; her choice; I explained that I am not a fan of prescription medicines either, but I'm not a fan or heart attacks or strokes; statin declined; encouraged healthy eating; she opts to recheck lipids in 3 months       Other Visit Diagnoses    Other chronic sinusitis    -  Primary   not improving after round of antibiotics; however, sx since Dec, sounds chronic, ddx include fungal etiology; CT scan ordered; denied by insurance   Relevant Orders   CT Maxillofacial W/Cm   CBC with Differential/Platelet   Basic metabolic panel   Ambulatory referral to ENT   Exophthalmos of left eye       pt not sure if new or old; with chronic infection, sx of sinus infection LEFT side, want CT sinuses; referring to ENT since CT scan denied by  insurance   Relevant Orders   CT Maxillofacial W/Cm   Ambulatory referral to ENT   Cough       smoker; however, getting better; patient politely declined a chest xray; call me if not resolve in 1-2 weeks       Follow up plan: No follow-ups on file.  An after-visit summary was printed and given to the patient at check-out.  Please see the patient instructions which may contain other information and recommendations beyond what is mentioned above in the assessment and plan.  No orders of the defined types were placed in this encounter.   Orders Placed This Encounter  Procedures  . CT Maxillofacial W/Cm  . CBC with Differential/Platelet  . Basic metabolic panel  . Ambulatory referral to ENT

## 2018-05-21 NOTE — Telephone Encounter (Signed)
Referral notes faxed to [facility] for urgent appointment.  I called to follow-up with ENT about appointment nurse states that Dr. Elenore Rota called patient and she refused appointment states she felt better.  Nurse states they will call and follow-up with her on Monday.

## 2018-05-21 NOTE — Assessment & Plan Note (Signed)
Encouraged cessation.

## 2018-05-21 NOTE — Patient Instructions (Signed)
Please do have the labs and the CT scan done today We'll get you to an Ear Nose Throat doctor if needed / not better I'll reach you at [phone] Call if needed Go to the emergency department if you develop serious / concerning symptoms like fever, bad headache, neck stiffness, confusion, vision changes, etc. Try to limit saturated fats in your diet (bologna, hot dogs, barbeque, cheeseburgers, hamburgers, steak, bacon, sausage, cheese, etc.) and get more fresh fruits, vegetables, and whole grains

## 2018-05-26 IMAGING — US US RENAL
1 series · 14 of 25 positions shown · non-contrast
Comparison: 10/25/2011

CLINICAL DATA: Suprapubic discomfort

EXAM:
RENAL / URINARY TRACT ULTRASOUND COMPLETE

[Series 1: us renal · 0.23mm/px · 14 of 41 slices shown]
[im 1/41]
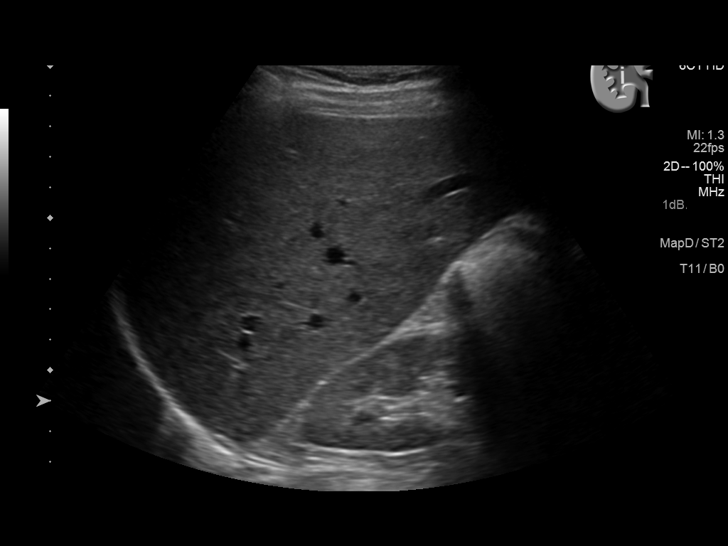
[im 4/41]
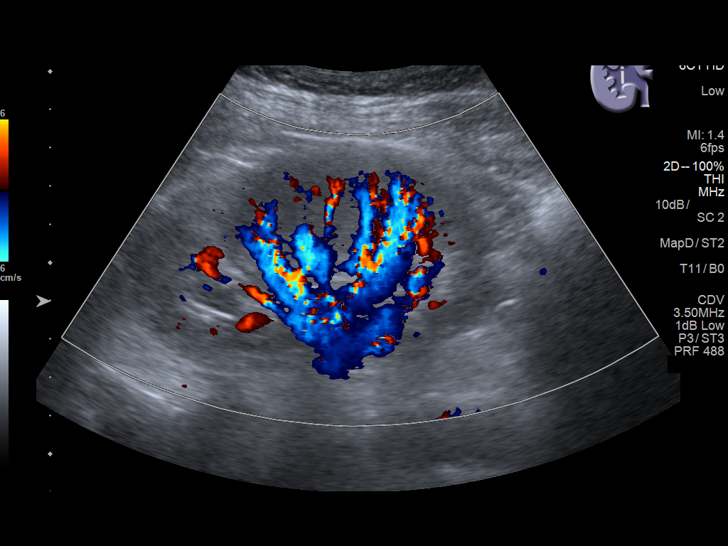
[im 7/41]
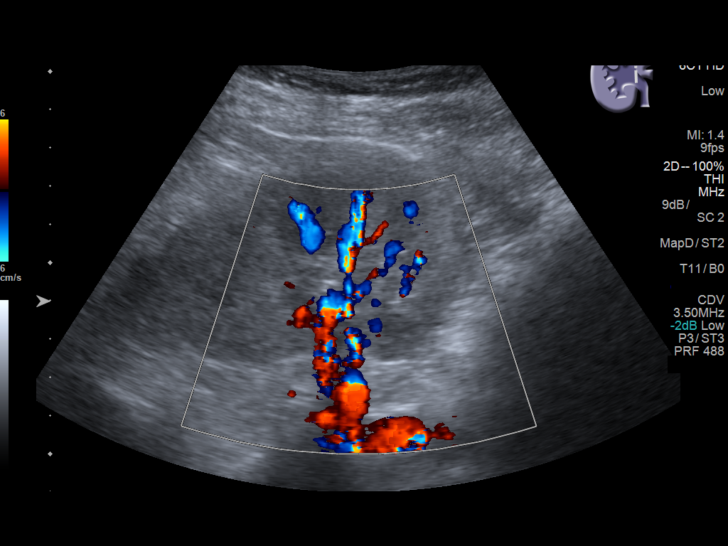
[im 11/41]
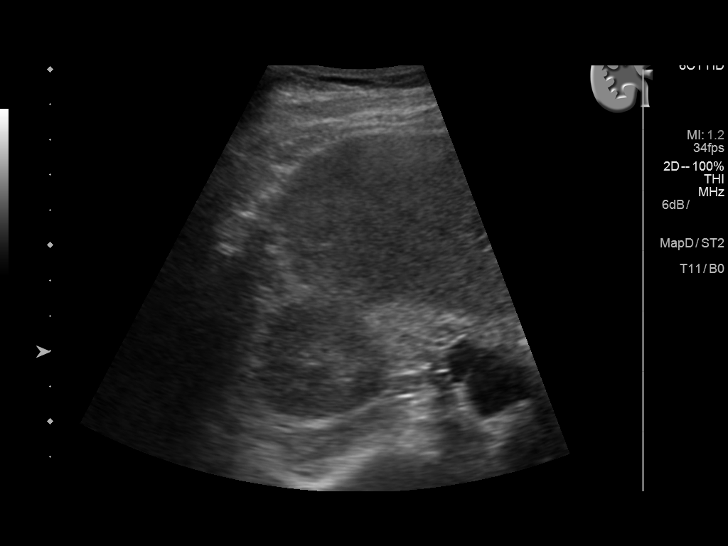
[im 14/41]
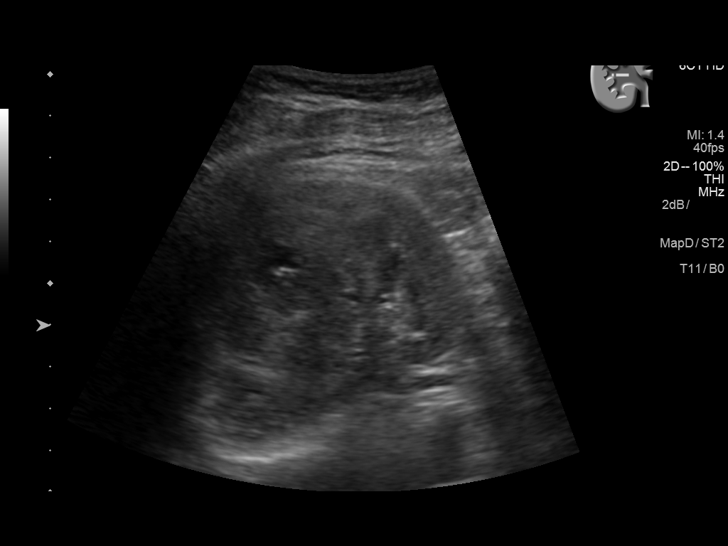
[im 16/41]
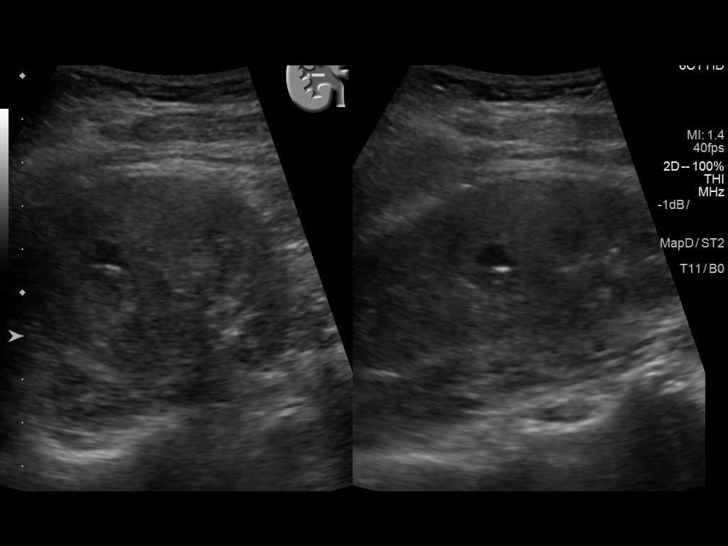
[im 19/41]
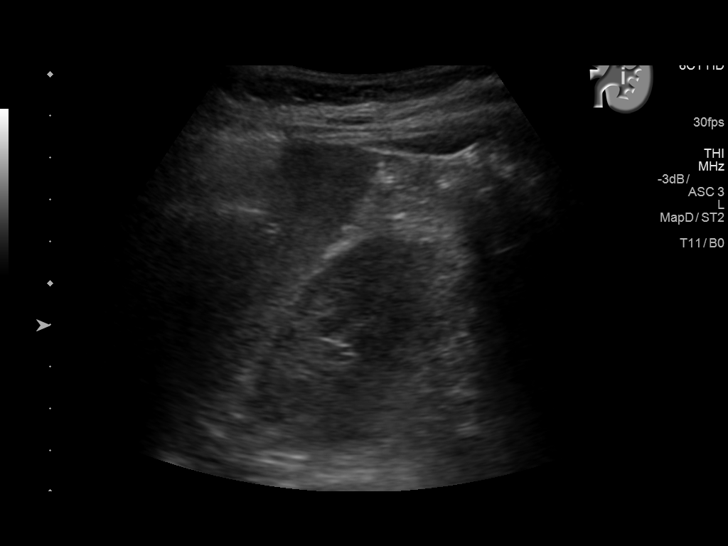
[im 22/41]
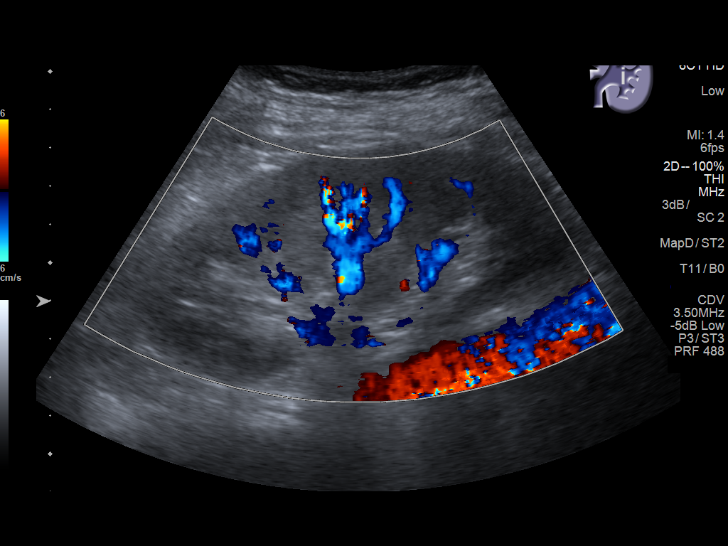
[im 26/41]
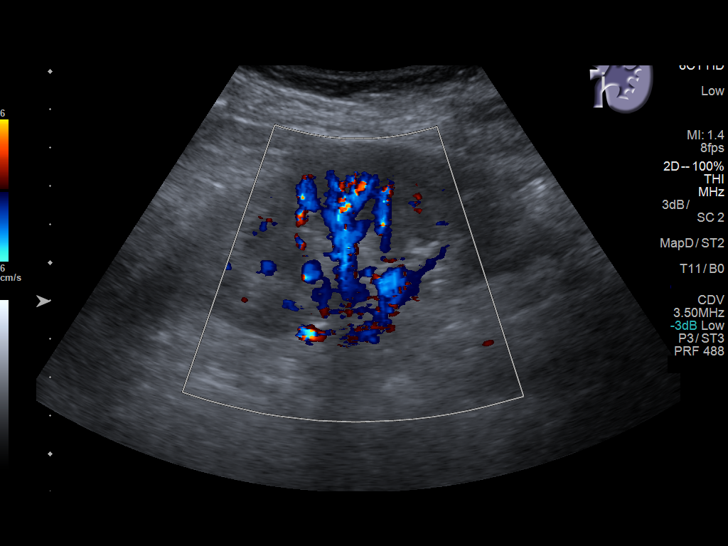
[im 27/41]
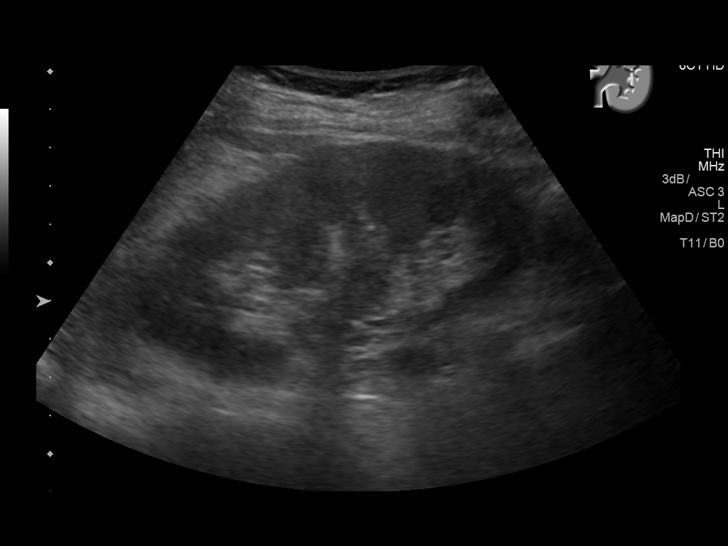
[im 31/41]
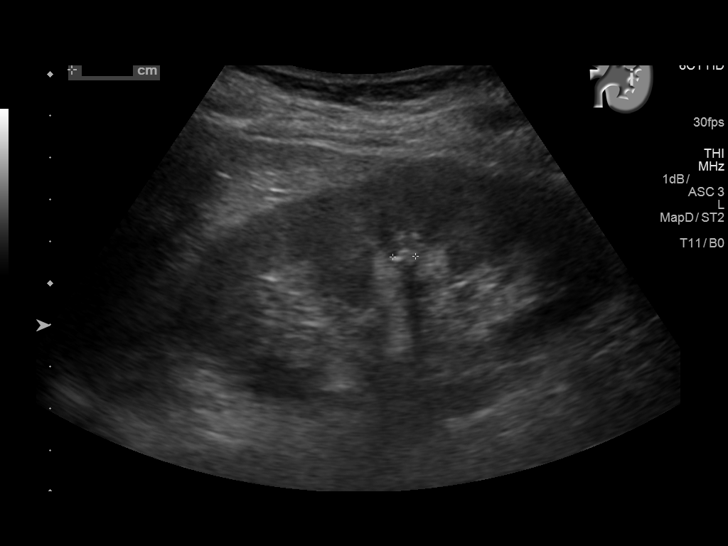
[im 34/41]
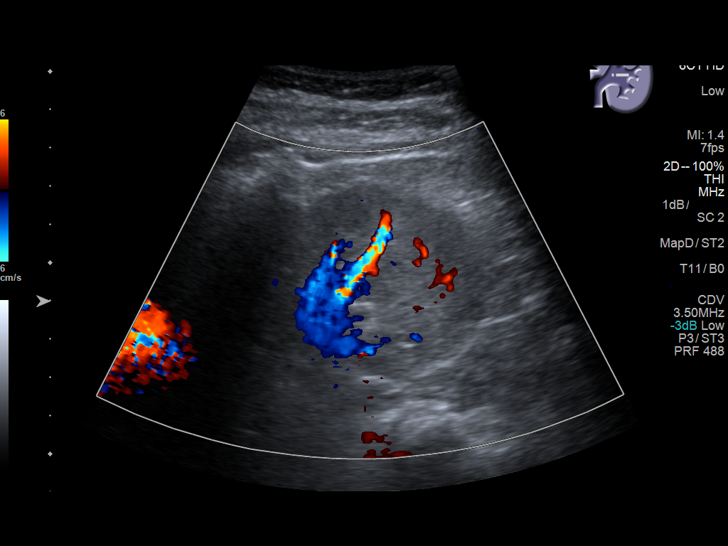
[im 37/41]
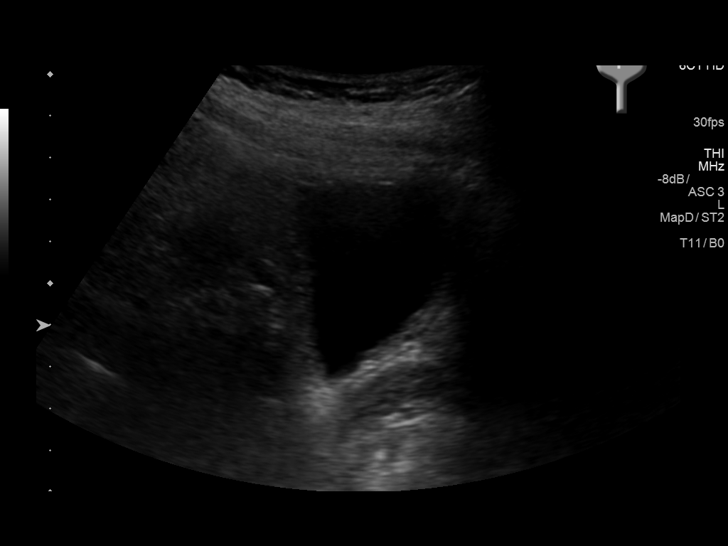
[im 41/41]
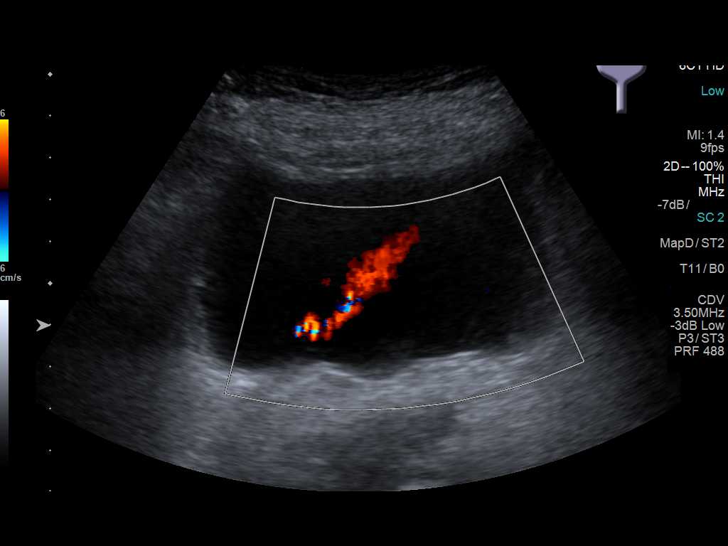

[14 of 25 positions shown; findings below may reference images not displayed]

FINDINGS: Right Kidney:

Length: 10.3 cm. Echogenicity within normal limits. 8 x 5 x 9 mm
anechoic right renal mass most consistent with a cyst. No solid mass
or hydronephrosis visualized.

Left Kidney:

Length: 11.6 cm. Echogenicity within normal limits. 5 mm shadowing
focus in the mid pole of the left kidney. No mass or hydronephrosis
visualized.

Bladder:

Appears normal for degree of bladder distention.
IMPRESSION: 1. No obstructive uropathy.
2. 5 mm nonobstructing left renal calculus.

## 2018-05-27 ENCOUNTER — Telehealth: Payer: Self-pay

## 2018-05-27 NOTE — Telephone Encounter (Signed)
Patient LVM for 06/03/18 colonoscopy to be canceled.  Returned her phone call LVM to let her know the message has been received and she may call back to reschedule.  Thanks [employer]

## 2018-05-29 ENCOUNTER — Other Ambulatory Visit: Payer: Self-pay | Admitting: Nurse Practitioner

## 2018-05-29 DIAGNOSIS — R0981 Nasal congestion: Secondary | ICD-10-CM

## 2018-06-03 ENCOUNTER — Ambulatory Visit: Admit: 2018-06-03 | Payer: BLUE CROSS/BLUE SHIELD | Admitting: Gastroenterology

## 2018-06-03 SURGERY — COLONOSCOPY WITH PROPOFOL
Anesthesia: General

## 2018-11-16 DIAGNOSIS — R69 Illness, unspecified: Secondary | ICD-10-CM | POA: Diagnosis not present

## 2019-02-01 ENCOUNTER — Other Ambulatory Visit: Payer: Self-pay

## 2019-02-01 ENCOUNTER — Ambulatory Visit (INDEPENDENT_AMBULATORY_CARE_PROVIDER_SITE_OTHER): Payer: Medicare HMO | Admitting: Internal Medicine

## 2019-02-01 ENCOUNTER — Encounter: Payer: Self-pay | Admitting: Internal Medicine

## 2019-02-01 VITALS — BP 104/74 | HR 68 | Temp 98.0°F | Ht 67.25 in | Wt 138.0 lb

## 2019-02-01 DIAGNOSIS — F17200 Nicotine dependence, unspecified, uncomplicated: Secondary | ICD-10-CM

## 2019-02-01 DIAGNOSIS — Z1211 Encounter for screening for malignant neoplasm of colon: Secondary | ICD-10-CM | POA: Diagnosis not present

## 2019-02-01 DIAGNOSIS — M7541 Impingement syndrome of right shoulder: Secondary | ICD-10-CM

## 2019-02-01 DIAGNOSIS — Z23 Encounter for immunization: Secondary | ICD-10-CM | POA: Diagnosis not present

## 2019-02-01 DIAGNOSIS — E039 Hypothyroidism, unspecified: Secondary | ICD-10-CM | POA: Diagnosis not present

## 2019-02-01 DIAGNOSIS — F39 Unspecified mood [affective] disorder: Secondary | ICD-10-CM | POA: Insufficient documentation

## 2019-02-01 DIAGNOSIS — Z7189 Other specified counseling: Secondary | ICD-10-CM | POA: Insufficient documentation

## 2019-02-01 DIAGNOSIS — M25811 Other specified joint disorders, right shoulder: Secondary | ICD-10-CM | POA: Insufficient documentation

## 2019-02-01 DIAGNOSIS — Z0000 Encounter for general adult medical examination without abnormal findings: Secondary | ICD-10-CM | POA: Diagnosis not present

## 2019-02-01 DIAGNOSIS — R69 Illness, unspecified: Secondary | ICD-10-CM | POA: Diagnosis not present

## 2019-02-01 LAB — COMPREHENSIVE METABOLIC PANEL
ALT: 13 U/L (ref 0–35)
AST: 18 U/L (ref 0–37)
Albumin: 4.2 g/dL (ref 3.5–5.2)
Alkaline Phosphatase: 74 U/L (ref 39–117)
BUN: 17 mg/dL (ref 6–23)
CO2: 30 mEq/L (ref 19–32)
Calcium: 9.7 mg/dL (ref 8.4–10.5)
Chloride: 102 mEq/L (ref 96–112)
Creatinine, Ser: 0.8 mg/dL (ref 0.40–1.20)
GFR: 71.59 mL/min (ref >60.00)
Glucose, Bld: 85 mg/dL (ref 70–99)
Potassium: 4.6 mEq/L (ref 3.5–5.1)
Sodium: 137 mEq/L (ref 135–145)
Total Bilirubin: 0.7 mg/dL (ref 0.2–1.2)
Total Protein: 7.1 g/dL (ref 6.0–8.3)

## 2019-02-01 LAB — CBC
HCT: 47.9 % — ABNORMAL HIGH (ref 36.0–46.0)
Hemoglobin: 16.1 g/dL — ABNORMAL HIGH (ref 12.0–15.0)
MCHC: 33.7 g/dL (ref 30.0–36.0)
MCV: 99.3 fl (ref 78.0–100.0)
Platelets: 281 10*3/uL (ref 150.0–400.0)
RBC: 4.82 Mil/uL (ref 3.87–5.11)
RDW: 13.8 % (ref 11.5–15.5)
WBC: 9 10*3/uL (ref 4.0–10.5)

## 2019-02-01 LAB — T4, FREE: Free T4: 1.45 ng/dL (ref 0.60–1.60)

## 2019-02-01 LAB — TSH: TSH: 1.45 u[IU]/mL (ref 0.35–4.50)

## 2019-02-01 NOTE — Addendum Note (Signed)
Addended by: Pilar Grammes on: 02/01/2019 12:54 PM   Modules accepted: Orders

## 2019-02-01 NOTE — Progress Notes (Signed)
Subjective:    Patient ID: Deborah Price, female    DOB: February 27, 1951, 68 y.o.   MRN: [id number]  HPI Here to establish care and for Medicare wellness visit  Had long time physician who left--didn't bond with the replacement physician (and then she left and they didn't give her someone else) Reviewed advanced directives Reviewed other doctors---- changing dentists, no other doctors Still smokes--- now daily with recent stress. Discussed Regular red wine---some stress drinking (discussed). No prior alcohol problems No hospitalizations or surgery in past year Trouble with her glasses--needs to be evaluated Hearing is fine Larey Seat once--shoulder injury Some mood issues  Independent with instrumental ADLs No memory problems  Had some sinus symptoms for a while Now better Used flonase for a while Also uses Neti pot  Long standing hypothyroidism ~1995 Idiopathic Dose increased a year ago with elevated TSH  Larey Seat about 2 months ago onto right shoulder Able to move it but can't rotate elbow properly (hard to do her ballet lessions though)  Still adjusting to her mom's (and dad's death) 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 levothyroxine (SYNTHROID, LEVOTHROID) 137 MCG tablet TAKE 1 TABLET (137 MCG TOTAL) BY MOUTH DAILY BEFORE BREAKFAST. (NOTE NEW DOSE) 90 tablet 3  . MULTIPLE VITAMIN PO Take 1 tablet by mouth daily.     No current facility-administered medications on file prior to visit.     Allergies  Allergen Reactions  . Ciprofloxacin Other (See Comments)    Possible tendon problems  . Codeine Nausea And Vomiting  . Penicillins Hives    Past Medical History:  Diagnosis Date  . Allergy   . Thyroid disease     Past Surgical History:  Procedure Laterality Date  .  ABDOMINAL HYSTERECTOMY  1996    Family History  Problem Relation Age of Onset  . Hypertension Mother   . Dementia Mother   . Bladder Cancer Neg Hx   . Kidney cancer Neg Hx   . Heart disease Neg Hx   . Cancer Neg Hx     Social History   Socioeconomic History  . Marital status: Single    Spouse name: Not on file  . Number of children: 0  . Years of education: Not on file  . Highest education level: Master's degree (e.g., MA, MS, MEng, MEd, MSW, MBA)  Occupational History  . Occupation: Emergency planning/management officer: [employer]    Comment: Adjunct and private  Social Needs  . Financial resource strain: Not hard at all  . Food insecurity    Worry: Never true    Inability: Never true  . Transportation needs    Medical: No    Non-medical: No  Tobacco Use  . Smoking status: Current Some Day Smoker    Packs/day: 0.50    Years: 30.00    Pack years: 15.00    Types: Cigarettes    Start date: 02/05/1985  . Smokeless tobacco: Never Used  . Tobacco comment: 4 cigarettes a day  Substance and Sexual Activity  . Alcohol use: Yes    Alcohol/week: 0.0 standard drinks    Comment: Occasional-wine on weekends  . Drug use: No  . Sexual activity: Not Currently  Lifestyle  . Physical activity    Days per week: 5 days    Minutes  per session: 150+ min  . Stress: Very much  Relationships  . Social connections    Talks on phone: More than three times a week    Gets together: Never    Attends religious service: 1 to 4 times per year    Active member of club or organization: No    Attends meetings of clubs or organizations: Never    Relationship status: Never married  . Intimate partner violence    Fear of current or ex partner: No    Emotionally abused: No    Physically abused: No    Forced sexual activity: No  Other Topics Concern  . Not on file  Social History Narrative   Patient is a Statistician at [employer]      No living will   Not sure about health care POA--might  be her niece (or brother)   Would accept resuscitation attempts   Not sure about tube feeds   Review of Systems  Constitutional:       Has lost a little---stressful work schedule Wears seat belt  HENT: Negative for dental problem and tinnitus.   Eyes:       No diplopia or unilateral vision loss  Respiratory: Negative for cough, chest tightness and shortness of breath.   Cardiovascular: Negative for chest pain, palpitations and leg swelling.  Gastrointestinal: Negative for blood in stool and constipation.  Endocrine: Negative for polydipsia and polyuria.  Genitourinary: Negative for difficulty urinating, dyspareunia and dysuria.  Musculoskeletal: Negative for back pain.       Right shoulder  Skin: Negative for rash.       No suspicious lesions  Allergic/Immunologic: Negative for environmental allergies and immunocompromised state.  Neurological: Negative for dizziness, syncope, light-headedness and headaches.  Hematological: Negative for adenopathy. Does not bruise/bleed easily.  Psychiatric/Behavioral: Negative for sleep disturbance.       Some mood issues as noted       Objective:   Physical Exam  Constitutional: She is oriented to person, place, and time. She appears well-developed. No distress.  HENT:  Mouth/Throat: Oropharynx is clear and moist. No oropharyngeal exudate.  Neck: No thyromegaly present.  Musculoskeletal:        General: No tenderness or edema.     Comments: Mild decrease in external rotation at right shoulder  Lymphadenopathy:    She has no cervical adenopathy.  Neurological: She is alert and oriented to person, place, and time.  President--- "Deborah Price, Deborah Price" [phone] D-l-r-o-w Recall 3/3  Skin: No rash noted. No erythema.  Psychiatric:  Mildly tearful discussing situation           Assessment & Plan:

## 2019-02-01 NOTE — Assessment & Plan Note (Signed)
I have personally reviewed the Medicare Annual Wellness questionnaire and have noted 1. The patient's medical and social history 2. Their use of alcohol, tobacco or illicit drugs 3. Their current medications and supplements 4. The patient's functional ability including ADL's, fall risks, home safety risks and hearing or visual             impairment. 5. Diet and physical activities 6. Evidence for depression or mood disorders  The patients weight, height, BMI and visual acuity have been recorded in the chart I have made referrals, counseling and provided education to the patient based review of the above and I have provided the pt with a written personalized care plan for preventive services.  I have provided you with a copy of your personalized plan for preventive services. Please take the time to review along with your updated medication list.  Will update with pneumovax Had flu vaccine Will do FIT Set up screening mammogram No PAP due to hyster Stays active

## 2019-02-01 NOTE — Assessment & Plan Note (Signed)
Seems to be euthyroid Will check labs 

## 2019-02-01 NOTE — Assessment & Plan Note (Signed)
Grieving, family stress with brother, COVID Mild dysthymia  No need for meds

## 2019-02-01 NOTE — Assessment & Plan Note (Signed)
Mild Does loosen up Will try dicolfenac topical

## 2019-02-01 NOTE — Patient Instructions (Addendum)
Please try over the counter diclofenac 2-4 times a day for your right shoulder. Please set up your screening mammogram.

## 2019-02-01 NOTE — Progress Notes (Signed)
Hearing Screening   Method: Audiometry   125Hz  250Hz  500Hz  1000Hz  2000Hz  3000Hz  4000Hz  6000Hz  8000Hz   Right ear:   20 20 20  20     Left ear:   20 20 20  20       Visual Acuity Screening   Right eye Left eye Both eyes  Without correction: 2/40 20/50 20/30  With correction:

## 2019-02-01 NOTE — Assessment & Plan Note (Signed)
Has increased this and the alcohol Counseled on both Consider counseling

## 2019-02-01 NOTE — Assessment & Plan Note (Signed)
See social history Blank forms given 

## 2019-03-05 ENCOUNTER — Other Ambulatory Visit: Payer: Self-pay | Admitting: Family Medicine

## 2019-03-05 NOTE — Telephone Encounter (Signed)
Requested medication (s) are due for refill today: yes  Requested medication (s) are on the active medication list: yes  Last refill:  11/12/2018  Future visit scheduled: yes  Notes to clinic: Medication last filled by Dr. Sanda Klein Review to see is refill is appropriate   Requested Prescriptions  Pending Prescriptions Disp Refills   levothyroxine (SYNTHROID) 137 MCG tablet [Pharmacy Med Name: LEVOTHYROXINE 137 MCG TABLET] 90 tablet 3    Sig: TAKE 1 TABLET ([place]) BY MOUTH DAILY BEFORE BREAKFAST. (NOTE NEW DOSE)      Endocrinology:  Hypothyroid Agents Failed - 03/05/2019 11:42 AM      Failed - TSH needs to be rechecked within 3 months after an abnormal result. Refill until TSH is due.      Passed - TSH in normal range and within 360 days    TSH  Date Value Ref Range Status  02/01/2019 1.45 0.35 - 4.50 uIU/mL Final          Passed - Valid encounter within last 12 months    Recent Outpatient Visits           9 months ago Other chronic sinusitis   [place], MD   10 months ago Acute non-recurrent frontal sinusitis   [place], NP   1 year ago [employer] preventive visit   [place], MD   2 years ago Bilateral leg cramps   [place], MD   2 years ago Suprapubic discomfort   [place], MD       Future Appointments             In 11 months Venia Carbon, MD [place] at [place], [facility]

## 2019-09-12 ENCOUNTER — Telehealth: Payer: Self-pay | Admitting: Internal Medicine

## 2019-09-12 NOTE — Telephone Encounter (Signed)
Record Updated.

## 2019-09-12 NOTE — Telephone Encounter (Signed)
Patient called to update [place] on her covid vaccine  Patient received the Pfizer vaccine 1st- 05/10/2018 2nd- 06/08/2019

## 2019-10-06 DIAGNOSIS — H524 Presbyopia: Secondary | ICD-10-CM | POA: Diagnosis not present

## 2019-10-06 DIAGNOSIS — H2513 Age-related nuclear cataract, bilateral: Secondary | ICD-10-CM | POA: Diagnosis not present

## 2019-10-06 DIAGNOSIS — Z0100 Encounter for examination of eyes and vision without abnormal findings: Secondary | ICD-10-CM | POA: Diagnosis not present

## 2019-10-31 DIAGNOSIS — R69 Illness, unspecified: Secondary | ICD-10-CM | POA: Diagnosis not present

## 2019-11-01 DIAGNOSIS — R69 Illness, unspecified: Secondary | ICD-10-CM | POA: Diagnosis not present

## 2019-11-17 DIAGNOSIS — R69 Illness, unspecified: Secondary | ICD-10-CM | POA: Diagnosis not present

## 2019-11-27 DIAGNOSIS — R69 Illness, unspecified: Secondary | ICD-10-CM | POA: Diagnosis not present

## 2019-12-29 DIAGNOSIS — R69 Illness, unspecified: Secondary | ICD-10-CM | POA: Diagnosis not present

## 2020-02-07 ENCOUNTER — Other Ambulatory Visit: Payer: Self-pay

## 2020-02-07 ENCOUNTER — Ambulatory Visit (INDEPENDENT_AMBULATORY_CARE_PROVIDER_SITE_OTHER): Payer: Medicare HMO | Admitting: Internal Medicine

## 2020-02-07 ENCOUNTER — Encounter: Payer: Self-pay | Admitting: Internal Medicine

## 2020-02-07 VITALS — BP 112/62

## 2020-02-07 DIAGNOSIS — Z7189 Other specified counseling: Secondary | ICD-10-CM | POA: Diagnosis not present

## 2020-02-07 DIAGNOSIS — Z1211 Encounter for screening for malignant neoplasm of colon: Secondary | ICD-10-CM

## 2020-02-07 DIAGNOSIS — F39 Unspecified mood [affective] disorder: Secondary | ICD-10-CM

## 2020-02-07 DIAGNOSIS — E039 Hypothyroidism, unspecified: Secondary | ICD-10-CM | POA: Diagnosis not present

## 2020-02-07 DIAGNOSIS — F17200 Nicotine dependence, unspecified, uncomplicated: Secondary | ICD-10-CM | POA: Diagnosis not present

## 2020-02-07 DIAGNOSIS — R69 Illness, unspecified: Secondary | ICD-10-CM | POA: Diagnosis not present

## 2020-02-07 DIAGNOSIS — Z0000 Encounter for general adult medical examination without abnormal findings: Secondary | ICD-10-CM

## 2020-02-07 LAB — T4, FREE: Free T4: 1.29 ng/dL (ref 0.60–1.60)

## 2020-02-07 LAB — COMPREHENSIVE METABOLIC PANEL
ALT: 11 U/L (ref 0–35)
AST: 17 U/L (ref 0–37)
Albumin: 4.2 g/dL (ref 3.5–5.2)
Alkaline Phosphatase: 82 U/L (ref 39–117)
BUN: 14 mg/dL (ref 6–23)
CO2: 30 mEq/L (ref 19–32)
Calcium: 9.7 mg/dL (ref 8.4–10.5)
Chloride: 99 mEq/L (ref 96–112)
Creatinine, Ser: 0.89 mg/dL (ref 0.40–1.20)
GFR: 66.91 mL/min (ref >60.00)
Glucose, Bld: 86 mg/dL (ref 70–99)
Potassium: 4.3 mEq/L (ref 3.5–5.1)
Sodium: 135 mEq/L (ref 135–145)
Total Bilirubin: 0.7 mg/dL (ref 0.2–1.2)
Total Protein: 7.3 g/dL (ref 6.0–8.3)

## 2020-02-07 LAB — CBC
HCT: 49.8 % — ABNORMAL HIGH (ref 36.0–46.0)
Hemoglobin: 16.9 g/dL — ABNORMAL HIGH (ref 12.0–15.0)
MCHC: 33.9 g/dL (ref 30.0–36.0)
MCV: 96.3 fl (ref 78.0–100.0)
Platelets: 284 10*3/uL (ref 150.0–400.0)
RBC: 5.17 Mil/uL — ABNORMAL HIGH (ref 3.87–5.11)
RDW: 13.4 % (ref 11.5–15.5)
WBC: 8.3 10*3/uL (ref 4.0–10.5)

## 2020-02-07 LAB — TSH: TSH: 1.92 u[IU]/mL (ref 0.35–4.50)

## 2020-02-07 NOTE — Assessment & Plan Note (Signed)
Still with some dysthymia/grieving  Some better than last year No indication for medication

## 2020-02-07 NOTE — Patient Instructions (Addendum)
You can try a nicotine patch (21mg ) daily along with as needed nicotine lozenge or gum. The patch is usually for 3 months or so. The prescription meds are varenicline (chantix) or bupropion. Both are twice a day and can also be used with nicotine gum or lozenge as needed.  Please set up your screening mammogram.

## 2020-02-07 NOTE — Assessment & Plan Note (Signed)
Discussed the importance of quitting Reviewed chantix/bupropion/nicotine replacement She will consider

## 2020-02-07 NOTE — Progress Notes (Signed)
Subjective:    Patient ID: Deborah Price, female    DOB: 1950/11/04, 69 y.o.   MRN: [id number]  HPI Here for Medicare wellness visit and follow up of chronic health conditions This visit occurred during the SARS-CoV-2 public health emergency.  Safety protocols were in place, including screening questions prior to the visit, additional usage of staff PPE, and extensive cleaning of exam room while observing appropriate contact time as indicated for disinfecting solutions.   Reviewed form and advanced directives Reviewed other doctors Does have 1-2 glasses of red wine in the evening Still smoking--discussed  Vision is okay-has to change out for near and far vision (didn't do well with progressive lenses) Hearing is fine No falls Exercises regularly Mild mood issues Independent with instrumental ADLs No memory issues  Working through parents' estate Things are better now with brother--but still some tension (that goes back many years) Still some grieving Does visit cemetary where they are---hasn't really gone through a crying stage Not overtly depressed Still loves her work at [employer]. Students dedicated, etc She does feel "tired" at times---fatigued  No other concerns Takes the levothyroxine daily No signs of problems with thyroid  Still smoking some Did increase this past year--- 1/3 to 1PPD now  Right shoulder had improved Continues to work on exercises  Current Outpatient Medications on File Prior to Visit  Medication Sig Dispense Refill  . levothyroxine (SYNTHROID) 137 MCG tablet TAKE 1 TABLET (137 MCG TOTAL) BY MOUTH DAILY BEFORE BREAKFAST. (NOTE NEW DOSE) 90 tablet 3  . MULTIPLE VITAMIN PO Take 1 tablet by mouth daily.     No current facility-administered medications on file prior to visit.    Allergies  Allergen Reactions  . Ciprofloxacin Other (See Comments)    Possible tendon problems  . Codeine Nausea And Vomiting  . Penicillins Hives    Past  Medical History:  Diagnosis Date  . Allergy   . Thyroid disease     Past Surgical History:  Procedure Laterality Date  . ABDOMINAL HYSTERECTOMY  1996    Family History  Problem Relation Age of Onset  . Hypertension Mother   . Dementia Mother   . Bladder Cancer Neg Hx   . Kidney cancer Neg Hx   . Heart disease Neg Hx   . Cancer Neg Hx     Social History   Socioeconomic History  . Marital status: Single    Spouse name: Not on file  . Number of children: 0  . Years of education: Not on file  . Highest education level: Master's degree (e.g., MA, MS, MEng, MEd, MSW, MBA)  Occupational History  . 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 Smoking status: Current Some Day Smoker    Packs/day: 0.50    Years: 30.00    Pack years: 15.00    Types: Cigarettes    Start date: 02/05/1985  . Smokeless tobacco: Never Used  . Tobacco comment: 4 cigarettes a day  Vaping Use  . Vaping Use: Never used  Substance and Sexual Activity  . Alcohol use: Yes    Alcohol/week: 0.0 standard drinks    Comment: Occasional-wine on weekends  . Drug use: No  . Sexual activity: Not Currently  Other Topics Concern  . Not on file  Social History Narrative   Patient is a Dentist at [employer]      No living will   Not sure  about health care POA--might be her niece (or brother)   Would accept resuscitation attempts   Not sure about tube feeds   Social Determinants of Health   Financial Resource Strain:   . Difficulty of Paying Living Expenses: Not on file  Food Insecurity:   . Worried About Programme researcher, broadcasting/film/video in the Last Year: Not on file  . Ran Out of Food in the Last Year: Not on file  Transportation Needs:   . Lack of Transportation (Medical): Not on file  . Lack of Transportation (Non-Medical): Not on file  Physical Activity:   . Days of Exercise per Week: Not on file  . Minutes of Exercise per Session: Not on file    Stress:   . Feeling of Stress : Not on file  Social Connections:   . Frequency of Communication with Friends and Family: Not on file  . Frequency of Social Gatherings with Friends and Family: Not on file  . Attends Religious Services: Not on file  . Active Member of Clubs or Organizations: Not on file  . Attends Banker Meetings: Not on file  . Marital Status: Not on file  Intimate Partner Violence:   . Fear of Current or Ex-Partner: Not on file  . Emotionally Abused: Not on file  . Physically Abused: Not on file  . Sexually Abused: Not on file   Review of Systems Appetite is okay--but not cooking as much.  Weight is stable Sleeps okay Wears seat belt Teeth okay---goes to dentist No chest pain or SOB Some recent sneezing--slight ear pressure. No allergies No dizziness or syncope No edema No heartburn or dysphagia Bowels are fine--no blood Voids more lately. No incontinence No other back or joint problems    Objective:   Physical Exam Constitutional:      Appearance: Normal appearance.  HENT:     Mouth/Throat:     Comments: No lesions Eyes:     Conjunctiva/sclera: Conjunctivae normal.     Pupils: Pupils are equal, round, and reactive to light.  Cardiovascular:     Rate and Rhythm: Normal rate and regular rhythm.     Pulses: Normal pulses.     Heart sounds: No murmur heard.  No gallop.   Pulmonary:     Effort: Pulmonary effort is normal.     Breath sounds: Normal breath sounds. No wheezing or rales.  Abdominal:     Palpations: Abdomen is soft.     Tenderness: There is no abdominal tenderness.  Musculoskeletal:     Cervical back: Neck supple.     Right lower leg: No edema.     Left lower leg: No edema.     Comments: Fairly normal active ROM in right shoulder now (still some decrease in abduction)  Lymphadenopathy:     Cervical: No cervical adenopathy.  Skin:    Findings: No rash.     Comments: Hemangiomas and other benign lesions (recommended  full body exam with derm)  Neurological:     Mental Status: She is alert and oriented to person, place, and time.     Comments: President---"Biden, Trump, Obama" [id number] D-l-r-o-w Recall 3/3  Psychiatric:        Mood and Affect: Mood normal.        Behavior: Behavior normal.            Assessment & Plan:

## 2020-02-07 NOTE — Assessment & Plan Note (Signed)
See social history 

## 2020-02-07 NOTE — Assessment & Plan Note (Signed)
Seems to be euthyroid Will check labs on the levothyroxine 

## 2020-02-07 NOTE — Assessment & Plan Note (Signed)
I have personally reviewed the Medicare Annual Wellness questionnaire and have noted 1. The patient's medical and social history 2. Their use of alcohol, tobacco or illicit drugs 3. Their current medications and supplements 4. The patient's functional ability including ADL's, fall risks, home safety risks and hearing or visual             impairment. 5. Diet and physical activities 6. Evidence for depression or mood disorders  The patients weight, height, BMI and visual acuity have been recorded in the chart I have made referrals, counseling and provided education to the patient based review of the above and I have provided the pt with a written personalized care plan for preventive services.  I have provided you with a copy of your personalized plan for preventive services. Please take the time to review along with your updated medication list.  Didn't get cancer screening due to last year's stress Needs screening mammogram Will give FIT again Yearly flu vaccine--has had other vaccines Dance instructor--- stays in shape

## 2020-02-07 NOTE — Progress Notes (Signed)
Hearing Screening   Method: Audiometry   125Hz  250Hz  500Hz  1000Hz  2000Hz  3000Hz  4000Hz  6000Hz  8000Hz   Right ear:   20 20 20  20     Left ear:   25 40 20  40    Vision Screening Comments: July 2021

## 2020-02-16 ENCOUNTER — Telehealth: Payer: Self-pay | Admitting: Radiology

## 2020-02-16 ENCOUNTER — Other Ambulatory Visit (INDEPENDENT_AMBULATORY_CARE_PROVIDER_SITE_OTHER): Payer: Medicare HMO

## 2020-02-16 DIAGNOSIS — Z1211 Encounter for screening for malignant neoplasm of colon: Secondary | ICD-10-CM

## 2020-02-16 LAB — FECAL OCCULT BLOOD, IMMUNOCHEMICAL: Fecal Occult Bld: POSITIVE — AB

## 2020-02-16 NOTE — Telephone Encounter (Signed)
[facility] called a POSITIVE ifob, results given to Dr Letvak 

## 2020-02-17 NOTE — Telephone Encounter (Signed)
See MyChart release Will send referral for colonoscopy

## 2020-06-24 DIAGNOSIS — Z20822 Contact with and (suspected) exposure to covid-19: Secondary | ICD-10-CM | POA: Diagnosis not present

## 2020-06-24 DIAGNOSIS — Z03818 Encounter for observation for suspected exposure to other biological agents ruled out: Secondary | ICD-10-CM | POA: Diagnosis not present

## 2020-08-14 ENCOUNTER — Other Ambulatory Visit: Payer: Self-pay | Admitting: Internal Medicine

## 2020-08-28 ENCOUNTER — Other Ambulatory Visit: Payer: Self-pay

## 2020-08-28 ENCOUNTER — Other Ambulatory Visit (INDEPENDENT_AMBULATORY_CARE_PROVIDER_SITE_OTHER): Payer: Medicare HMO

## 2020-08-28 ENCOUNTER — Encounter: Payer: Self-pay | Admitting: Internal Medicine

## 2020-08-28 ENCOUNTER — Telehealth (INDEPENDENT_AMBULATORY_CARE_PROVIDER_SITE_OTHER): Payer: Medicare HMO | Admitting: Internal Medicine

## 2020-08-28 DIAGNOSIS — B349 Viral infection, unspecified: Secondary | ICD-10-CM | POA: Diagnosis not present

## 2020-08-28 LAB — POC INFLUENZA A&B (BINAX/QUICKVUE)
Influenza A, POC: NEGATIVE
Influenza B, POC: NEGATIVE

## 2020-08-28 NOTE — Telephone Encounter (Signed)
Called pt and made virtual visit for 8am.

## 2020-08-28 NOTE — Progress Notes (Signed)
Subjective:    Patient ID: Deborah Price, female    DOB: 02/08/1951, 70 y.o.   MRN: [id number]  HPI Telephone virtual visit due to fever and respiratory symptoms (unable to establish video connection) Identification done Reviewed limitations and billing and she gave consent Participants---patient in her home and I am in my office  Started feeling bad yesterday afternoon---felt cold (but no A/C on) This cold feeling progressed---then had "uncontrollable shaking" No cough---"just couldn't warm up" Temp up to 103 at that point Stayed in bed Temp did go down later in the day Did try home COVID test--was negative (not sure she did it correct)  Some diarrhea (2 watery loose stools) last night---and slight nausea (no vomiting)  Some rhinorrhea No head congestion No sore throat No SOB Just feels "really tired"  Tried tylenol 250mg  four times  Current Outpatient Medications on File Prior to Visit  Medication Sig Dispense Refill   levothyroxine (SYNTHROID) 137 MCG tablet TAKE 1 TABLET (137 MCG TOTAL) BY MOUTH DAILY BEFORE BREAKFAST. (NOTE NEW DOSE) 90 tablet 3   MULTIPLE VITAMIN PO Take 1 tablet by mouth daily.     No current facility-administered medications on file prior to visit.    Allergies  Allergen Reactions   Ciprofloxacin Other (See Comments)    Possible tendon problems   Codeine Nausea And Vomiting   Penicillins Hives    Past Medical History:  Diagnosis Date   Allergy    Thyroid disease     Past Surgical History:  Procedure Laterality Date   ABDOMINAL HYSTERECTOMY  1996    Family History  Problem Relation Age of Onset   Hypertension Mother    Dementia Mother    Bladder Cancer Neg Hx    Kidney cancer Neg Hx    Heart disease Neg Hx    Cancer Neg Hx     Social History   Socioeconomic History   Marital status: Single    Spouse name: Not on file   Number of children: 0   Years of education: Not on file   Highest education level: Master's degree  (e.g., MA, MS, MEng, MEd, MSW, MBA)  Occupational History   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  Tobacco Use   Smoking status: Some Days    Packs/day: 0.50    Years: 30.00    Pack years: 15.00    Types: Cigarettes    Start date: 02/05/1985   Smokeless tobacco: Never   Tobacco comments:    4 cigarettes a day  Vaping Use   Vaping Use: Never used  Substance and Sexual Activity   Alcohol use: Yes    Alcohol/week: 0.0 standard drinks    Comment: Occasional-wine on weekends   Drug use: No   Sexual activity: Not Currently  Other Topics Concern   Not on file  Social History Narrative   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      No living will   Not sure about health care POA--might be her niece (or brother)   Would accept resuscitation attempts   Not sure about tube feeds   Social Determinants of Health   Financial Resource Strain: Not on file  Food Insecurity: Not on file  Transportation Needs: Not on file  Physical Activity: Not on file  Stress: Not on file  Social Connections: Not on file  Intimate Partner Violence: Not on file   Review of Systems No abdominal pain  Drinking lots of water---limited food No change in smell or taste No known COVID exposure---wearing mask for all her dance classes     Objective:   Physical Exam Pulmonary:     Effort: Pulmonary effort is normal.     Comments: No apparent SOB Neurological:     Mental Status: She is alert.           Assessment & Plan:

## 2020-08-28 NOTE — Assessment & Plan Note (Signed)
Classic fever, chills, fatigue No major respiratory symptoms---but still COVID and flu are possible Discussed rest, tylenol (higher dose), hydration Will check COVID PCR and rapid flu  tamiflu if positive flu-----paxlovid if COVID positive  Total time 14 minutes for this visit

## 2020-08-29 LAB — NOVEL CORONAVIRUS, NAA: SARS-CoV-2, NAA: NOT DETECTED

## 2020-08-29 LAB — SARS-COV-2, NAA 2 DAY TAT

## 2020-09-18 NOTE — Telephone Encounter (Signed)
[facility] Night - Client Nonclinical Telephone Record  [facility] Client [name] Night - Client Client Site [name] - Night Physician Tillman Abide- MD Contact Type Call Who Is Calling Patient / Member / Family / Caregiver Caller Name Enola Siebers Caller Phone Number [phone] Patient Name Deborah Price Patient DOB 1951-01-09 Call Type Message Only Information Provided Reason for Call Request to Schedule Office Appointment Initial Comment Caller states she had left a message through [place]. She got a really high fever 103 back in June and was keeping the doctor informed. The fevers aren't as high maybe 99-100. Doctor suggested Tylenol 500 each. That has helped. Saturday she got another fever 103.9 and she had shaking as well. Covid and flu those were negative. She has lower back pain. States the doctor was wanting to check for mono. Patient request to speak to RN No Additional Comment Caller declined triage. Office hours provided and advised to follow up with the office. Disp. Time Disposition Final User 09/18/2020 8:04:44 AM General Information Provided Yes Dorcas Carrow Call Closed By: Dorcas Carrow Transaction Date/Time: 09/18/2020 7:56:50 AM (ET)

## 2020-09-18 NOTE — Telephone Encounter (Signed)
Spoke to pt. Made appt for 1015 tomorrow. Gave her [facility] Door directions and the [facility] Phone number to call upon arrival.

## 2020-09-19 ENCOUNTER — Ambulatory Visit (INDEPENDENT_AMBULATORY_CARE_PROVIDER_SITE_OTHER): Payer: Medicare HMO | Admitting: Internal Medicine

## 2020-09-19 ENCOUNTER — Ambulatory Visit (INDEPENDENT_AMBULATORY_CARE_PROVIDER_SITE_OTHER)
Admission: RE | Admit: 2020-09-19 | Discharge: 2020-09-19 | Disposition: A | Discharge status: Home / Self Care | Payer: Medicare HMO | Source: Ambulatory Visit | Attending: Internal Medicine | Admitting: Internal Medicine

## 2020-09-19 ENCOUNTER — Encounter: Payer: Self-pay | Admitting: Internal Medicine

## 2020-09-19 ENCOUNTER — Other Ambulatory Visit: Payer: Self-pay

## 2020-09-19 VITALS — BP 110/78 | HR 64 | Temp 96.1°F | Ht 68.0 in | Wt 132.0 lb

## 2020-09-19 DIAGNOSIS — R509 Fever, unspecified: Secondary | ICD-10-CM | POA: Diagnosis not present

## 2020-09-19 DIAGNOSIS — J439 Emphysema, unspecified: Secondary | ICD-10-CM | POA: Diagnosis not present

## 2020-09-19 LAB — CBC
HCT: 45 % (ref 36.0–46.0)
Hemoglobin: 15.5 g/dL — ABNORMAL HIGH (ref 12.0–15.0)
MCHC: 34.4 g/dL (ref 30.0–36.0)
MCV: 98.1 fl (ref 78.0–100.0)
Platelets: 314 10*3/uL (ref 150.0–400.0)
RBC: 4.59 Mil/uL (ref 3.87–5.11)
RDW: 13.2 % (ref 11.5–15.5)
WBC: 10.5 10*3/uL (ref 4.0–10.5)

## 2020-09-19 LAB — COMPREHENSIVE METABOLIC PANEL
ALT: 28 U/L (ref 0–35)
AST: 33 U/L (ref 0–37)
Albumin: 3.8 g/dL (ref 3.5–5.2)
Alkaline Phosphatase: 148 U/L — ABNORMAL HIGH (ref 39–117)
BUN: 15 mg/dL (ref 6–23)
CO2: 28 mEq/L (ref 19–32)
Calcium: 9.8 mg/dL (ref 8.4–10.5)
Chloride: 99 mEq/L (ref 96–112)
Creatinine, Ser: 0.9 mg/dL (ref 0.40–1.20)
GFR: 65.73 mL/min (ref >60.00)
Glucose, Bld: 91 mg/dL (ref 70–99)
Potassium: 4.1 mEq/L (ref 3.5–5.1)
Sodium: 134 mEq/L — ABNORMAL LOW (ref 135–145)
Total Bilirubin: 0.8 mg/dL (ref 0.2–1.2)
Total Protein: 7.9 g/dL (ref 6.0–8.3)

## 2020-09-19 LAB — SEDIMENTATION RATE: Sed Rate: 16 mm/hr (ref 0–30)

## 2020-09-19 LAB — POC URINALSYSI DIPSTICK (AUTOMATED)
Glucose, UA: NEGATIVE
Nitrite, UA: NEGATIVE
Protein, UA: POSITIVE — AB
Spec Grav, UA: >=1.03 — AB (ref 1.010–1.025)
Urobilinogen, UA: 1 E.U./dL
pH, UA: 5.5 (ref 5.0–8.0)

## 2020-09-19 NOTE — Progress Notes (Signed)
Subjective:    Patient ID: Deborah Price, female    DOB: 06-25-1950, 70 y.o.   MRN: [id number]  HPI Here due to persistent viral type symptoms This visit occurred during the SARS-CoV-2 public health emergency.  Safety protocols were in place, including screening questions prior to the visit, additional usage of staff PPE, and extensive cleaning of exam room while observing appropriate contact time as indicated for disinfecting solutions.   Started with symptoms in mid June (like 6/12) Felt okay--then started to feel cool around lunchtime Then felt feverish---and "just kept going" Had shaking chills---like never before Did home COVID test but negative Tried tylenol then (low dose--then increased after contacting me)  Fever up to 101 intermittently--up and down 4 days ago--it went back up to 103.9 (but shakes not as bad as at first) Had been using tylenol regularly (like 1000 bid at most)  Has had "a little indigestion"---better with milk or boost No cough or SOB No dysuria, hematuria or urgency Urine did look somewhat darker today (orange tinge)  No rash No jaundice that she knows of  Current Outpatient Medications on File Prior to Visit  Medication Sig Dispense Refill   levothyroxine (SYNTHROID) 137 MCG tablet TAKE 1 TABLET (137 MCG TOTAL) BY MOUTH DAILY BEFORE BREAKFAST. (NOTE NEW DOSE) 90 tablet 3   MULTIPLE VITAMIN PO Take 1 tablet by mouth daily.     No current facility-administered medications on file prior to visit.    Allergies  Allergen Reactions   Ciprofloxacin Other (See Comments)    Possible tendon problems   Codeine Nausea And Vomiting   Penicillins Hives    Past Medical History:  Diagnosis Date   Allergy    Thyroid disease     Past Surgical History:  Procedure Laterality Date   ABDOMINAL HYSTERECTOMY  1996    Family History  Problem Relation Age of Onset   Hypertension Mother    Dementia Mother    Bladder Cancer Neg Hx    Kidney cancer Neg Hx     Heart disease Neg Hx    Cancer Neg Hx     Social History   Socioeconomic History   Marital status: Single    Spouse name: Not on file   Number of children: 0   Years of education: Not on file   Highest education level: Master's degree (e.g., MA, MS, MEng, MEd, MSW, MBA)  Occupational History   Occupation: Emergency planning/management officer: [employer]    Comment: Adjunct and private  Tobacco Use   Smoking status: Some Days    Packs/day: 0.50    Years: 30.00    Pack years: 15.00    Types: Cigarettes    Start date: 02/05/1985   Smokeless tobacco: Never   Tobacco comments:    4 cigarettes a day  Vaping Use   Vaping Use: Never used  Substance and Sexual Activity   Alcohol use: Yes    Alcohol/week: 0.0 standard drinks    Comment: Occasional-wine on weekends   Drug use: No   Sexual activity: Not Currently  Other Topics Concern   Not on file  Social History Narrative   Patient is a Dentist at [employer]      No living will   Not sure about health care POA--might be her niece (or brother)   Would accept resuscitation attempts   Not sure about tube feeds   Social Determinants of Health   Financial Resource Strain: Not on file  Food Insecurity: Not on file  Transportation Needs: Not on file  Physical Activity: Not on file  Stress: Not on file  Social Connections: Not on file  Intimate Partner Violence: Not on file    Review of Systems 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    Objective:   Physical Exam Constitutional:      Appearance: Normal appearance.  HENT:     Mouth/Throat:     Pharynx: No oropharyngeal exudate or posterior oropharyngeal erythema.  Cardiovascular:     Rate and Rhythm: Normal rate and regular rhythm.     Heart sounds: No murmur heard.   No gallop.  Pulmonary:      Effort: Pulmonary effort is normal.     Breath sounds: Normal breath sounds. No wheezing or rales.  Abdominal:     Palpations: Abdomen is soft.     Tenderness: There is no abdominal tenderness.  Musculoskeletal:     Cervical back: Neck supple.     Right lower leg: No edema.     Left lower leg: No edema.     Comments: No active joint swelling or tenderness  Lymphadenopathy:     Cervical: No cervical adenopathy.  Skin:    Findings: No rash.  Neurological:     General: No focal deficit present.     Mental Status: She is alert and oriented to person, place, and time.           Assessment & Plan:

## 2020-09-19 NOTE — Addendum Note (Signed)
Addended by: Eual Fines on: 09/19/2020 10:54 AM   Modules accepted: Orders

## 2020-09-19 NOTE — Assessment & Plan Note (Signed)
Close to 1 month of persistent up and down fevers No respiratory symptoms COVID tests negative Some dark urine/indigestion--will check for hepatitis Urinalysis not striking and no symptoms--will check urine culture Labs If persists, will set up with ID

## 2020-09-20 LAB — URINE CULTURE
MICRO NUMBER:: 12086837
SPECIMEN QUALITY:: ADEQUATE

## 2020-09-20 MED ORDER — SULFAMETHOXAZOLE-TRIMETHOPRIM 800-160 MG PO TABS: 1.0000 | ORAL_TABLET | Freq: Two times a day (BID) | ORAL | 0 refills | Status: DC

## 2020-09-20 NOTE — Telephone Encounter (Signed)
Patient is calling in wanting Dr.letvak to be aware of message below.

## 2021-02-12 ENCOUNTER — Ambulatory Visit (INDEPENDENT_AMBULATORY_CARE_PROVIDER_SITE_OTHER): Payer: Medicare HMO | Admitting: Internal Medicine

## 2021-02-12 ENCOUNTER — Other Ambulatory Visit: Payer: Self-pay

## 2021-02-12 ENCOUNTER — Encounter: Payer: Self-pay | Admitting: Internal Medicine

## 2021-02-12 VITALS — BP 124/82 | HR 67 | Temp 96.4°F | Ht 67.0 in | Wt 133.2 lb

## 2021-02-12 DIAGNOSIS — Z0000 Encounter for general adult medical examination without abnormal findings: Secondary | ICD-10-CM | POA: Diagnosis not present

## 2021-02-12 DIAGNOSIS — Z7189 Other specified counseling: Secondary | ICD-10-CM

## 2021-02-12 DIAGNOSIS — R195 Other fecal abnormalities: Secondary | ICD-10-CM

## 2021-02-12 DIAGNOSIS — E039 Hypothyroidism, unspecified: Secondary | ICD-10-CM

## 2021-02-12 NOTE — Assessment & Plan Note (Signed)
Now has formal document--she will bring in copy

## 2021-02-12 NOTE — Progress Notes (Signed)
Subjective:    Patient ID: Deborah Price, female    DOB: Jul 10, 1950, 70 y.o.   MRN: [id number]  HPI Here for Medicare wellness visit and follow up of chronic health conditions This visit occurred during the SARS-CoV-2 public health emergency.  Safety protocols were in place, including screening questions prior to the visit, additional usage of staff PPE, and extensive cleaning of exam room while observing appropriate contact time as indicated for disinfecting solutions.   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 (NOTE NEW DOSE) 90 tablet 3   MULTIPLE VITAMIN PO Take 1 tablet by mouth daily.     No current facility-administered medications on file prior to visit.    Allergies  Allergen Reactions   Ciprofloxacin Other (See Comments)    Possible tendon problems   Codeine Nausea And Vomiting   Penicillins Hives    Past Medical History:  Diagnosis Date   Allergy    Thyroid disease     Past Surgical History:  Procedure Laterality Date   ABDOMINAL HYSTERECTOMY  1996    Family History  Problem Relation Age of Onset   Hypertension Mother    Dementia Mother    Bladder Cancer Neg Hx    Kidney cancer Neg Hx    Heart  disease Neg Hx    Cancer Neg Hx     Social History   Socioeconomic History   Marital status: Single    Spouse name: Not on file   Number of children: 0   Years of education: Not on file   Highest education level: Master's degree (e.g., MA, MS, MEng, MEd, MSW, MBA)  Occupational History   Occupation: Emergency planning/management officer: [employer]    Comment: Adjunct and private  Tobacco Use   Smoking status: Some Days    Packs/day: 0.50    Years: 30.00    Pack years: 15.00    Types: Cigarettes    Start date: 02/05/1985   Smokeless tobacco: Never   Tobacco comments:    4 cigarettes a day  Vaping Use   Vaping Use: Never used  Substance and Sexual Activity   Alcohol use: Yes    Alcohol/week: 0.0 standard drinks    Comment: Occasional-wine on weekends   Drug use: No   Sexual activity: Not Currently  Other Topics Concern   Not on file  Social History Narrative   Patient is a Dentist at [employer]      No living will   [employer] Deborah Price is health care POA   Would accept resuscitation attempts   Not sure about tube feeds   Social Determinants of Health   Financial Resource Strain: Not on file  Food Insecurity: Not on file  Transportation Needs: Not on file  Physical Activity: Not  on file  Stress: Not on file  Social Connections: Not on file  Intimate Partner Violence: Not on file   Review of Systems Appetite is okay Weight is stable Sleeps okay---but some early awakening with stress in past 2 weeks Wears seat belt Teeth are okay---needs new dentist No suspicious lesions---plans derm visit for keratosis on left cheek No heartburn or dysphagia Bowels move fine No sig back or joint pains No chest pain or SOB     Objective:   Physical Exam Constitutional:      Appearance: Normal appearance.  HENT:     Mouth/Throat:     Comments: No lesions Eyes:     Conjunctiva/sclera: Conjunctivae normal.     Pupils: Pupils are equal, round, and reactive  to light.  Cardiovascular:     Rate and Rhythm: Normal rate and regular rhythm.     Pulses: Normal pulses.     Heart sounds: No murmur heard.   No gallop.  Pulmonary:     Effort: Pulmonary effort is normal.     Breath sounds: Normal breath sounds. No wheezing or rales.  Abdominal:     Palpations: Abdomen is soft.     Tenderness: There is no abdominal tenderness.  Musculoskeletal:     Cervical back: Neck supple.     Right lower leg: No edema.     Left lower leg: No edema.  Lymphadenopathy:     Cervical: No cervical adenopathy.  Skin:    Findings: No lesion or rash.  Neurological:     General: No focal deficit present.     Mental Status: She is alert and oriented to person, place, and time.     Comments: Normal cognition  Psychiatric:        Mood and Affect: Mood normal.        Behavior: Behavior normal.           Assessment & Plan:

## 2021-02-12 NOTE — Assessment & Plan Note (Signed)
I have personally reviewed the Medicare Annual Wellness questionnaire and have noted 1. The patient's medical and social history 2. Their use of alcohol, tobacco or illicit drugs 3. Their current medications and supplements 4. The patient's functional ability including ADL's, fall risks, home safety risks and hearing or visual             impairment. 5. Diet and physical activities 6. Evidence for depression or mood disorders  The patients weight, height, BMI and visual acuity have been recorded in the chart I have made referrals, counseling and provided education to the patient based review of the above and I have provided the pt with a written personalized care plan for preventive services.  I have provided you with a copy of your personalized plan for preventive services. Please take the time to review along with your updated medication list.  Stays fit with ballet Had bivalent COVID and flu vaccines Overdue for mammogram--she will set up Refer again for colon--positive FIT

## 2021-02-12 NOTE — Progress Notes (Signed)
Hearing Screening   250Hz  500Hz  1000Hz  2000Hz  4000Hz   Right ear 20 20 20 20 20   Left ear 20 20 20 20 20    Vision Screening   Right eye Left eye Both eyes  Without correction     With correction 20/20 20/20 20/13

## 2021-02-12 NOTE — Assessment & Plan Note (Signed)
Seems to be euthyroid Will check labs 

## 2021-02-13 LAB — COMPREHENSIVE METABOLIC PANEL
ALT: 10 U/L (ref 0–35)
AST: 18 U/L (ref 0–37)
Albumin: 4.3 g/dL (ref 3.5–5.2)
Alkaline Phosphatase: 63 U/L (ref 39–117)
BUN: 15 mg/dL (ref 6–23)
CO2: 25 mEq/L (ref 19–32)
Calcium: 9.8 mg/dL (ref 8.4–10.5)
Chloride: 103 mEq/L (ref 96–112)
Creatinine, Ser: 0.96 mg/dL (ref 0.40–1.20)
GFR: 60.66 mL/min (ref >60.00)
Glucose, Bld: 86 mg/dL (ref 70–99)
Potassium: 4.4 mEq/L (ref 3.5–5.1)
Sodium: 136 mEq/L (ref 135–145)
Total Bilirubin: 0.8 mg/dL (ref 0.2–1.2)
Total Protein: 7.3 g/dL (ref 6.0–8.3)

## 2021-02-13 LAB — CBC
HCT: 48.1 % — ABNORMAL HIGH (ref 36.0–46.0)
Hemoglobin: 15.9 g/dL — ABNORMAL HIGH (ref 12.0–15.0)
MCHC: 33.1 g/dL (ref 30.0–36.0)
MCV: 99.1 fl (ref 78.0–100.0)
Platelets: 280 10*3/uL (ref 150.0–400.0)
RBC: 4.85 Mil/uL (ref 3.87–5.11)
RDW: 13.4 % (ref 11.5–15.5)
WBC: 8.1 10*3/uL (ref 4.0–10.5)

## 2021-02-13 LAB — TSH: TSH: 5.4 u[IU]/mL (ref 0.35–5.50)

## 2021-02-13 LAB — T4, FREE: Free T4: 1.21 ng/dL (ref 0.60–1.60)

## 2021-02-25 ENCOUNTER — Telehealth: Payer: Self-pay

## 2021-02-25 ENCOUNTER — Other Ambulatory Visit: Payer: Self-pay

## 2021-02-25 DIAGNOSIS — R195 Other fecal abnormalities: Secondary | ICD-10-CM

## 2021-02-25 DIAGNOSIS — Z1211 Encounter for screening for malignant neoplasm of colon: Secondary | ICD-10-CM

## 2021-02-25 MED ORDER — NA SULFATE-K SULFATE-MG SULF 17.5-3.13-1.6 GM/177ML PO SOLN: 1.0000 | Freq: Once | ORAL | 0 refills | Status: AC

## 2021-02-25 NOTE — Progress Notes (Signed)
Gastroenterology Pre-Procedure Review  Request Date: 03/22/2021 Requesting Physician: Dr. Allegra Lai  PATIENT REVIEW QUESTIONS: The patient responded to the following health history questions as indicated:    1. Are you having any GI issues? no 2. Do you have a personal history of Polyps?  04/29/2005 No polyps removed. 3. Do you have a family history of Colon Cancer or Polyps? no 4. Diabetes Mellitus? no 5. Joint replacements in the past 12 months?no 6. Major health problems in the past 3 months?no 7. Any artificial heart valves, MVP, or defibrillator?no    MEDICATIONS & ALLERGIES:    Patient reports the following regarding taking any anticoagulation/antiplatelet therapy:   Plavix, Coumadin, Eliquis, Xarelto, Lovenox, Pradaxa, Brilinta, or Effient? no Aspirin? no  Patient confirms/reports the following medications:  Current Outpatient Medications  Medication Sig Dispense Refill   levothyroxine (SYNTHROID) 137 MCG tablet TAKE 1 TABLET (137 MCG TOTAL) BY MOUTH DAILY BEFORE BREAKFAST. (NOTE NEW DOSE) 90 tablet 3   MULTIPLE VITAMIN PO Take 1 tablet by mouth daily.     No current facility-administered medications for this visit.    Patient confirms/reports the following allergies:  Allergies  Allergen Reactions   Ciprofloxacin Other (See Comments)    Possible tendon problems   Codeine Nausea And Vomiting   Penicillins Hives    No orders of the defined types were placed in this encounter.   AUTHORIZATION INFORMATION Primary Insurance: 1D#: Group #:  Secondary Insurance: 1D#: Group #:  SCHEDULE INFORMATION: Date: 03/22/2021 Time: Location: [facility]

## 2021-02-25 NOTE — Telephone Encounter (Signed)
Inbound call from pt requesting a call back stating that she is ready to schedule her colonoscopy.

## 2021-02-25 NOTE — Telephone Encounter (Signed)
Procedure scheduled for 03/22/2021. 

## 2021-03-21 NOTE — Anesthesia Preprocedure Evaluation (Addendum)
Anesthesia Evaluation  Patient identified by MRN, date of birth, ID band Patient awake    Airway Mallampati: II  TM Distance: >3 FB Neck ROM: Full    Dental no notable dental hx.    Pulmonary Current Smoker,    Pulmonary exam normal breath sounds clear to auscultation       Cardiovascular Normal cardiovascular exam Rhythm:Regular Rate:Normal  H/O MVP   Neuro/Psych    GI/Hepatic   Endo/Other  Hypothyroidism   Renal/GU Renal disease     Musculoskeletal  (+) Arthritis ,   Abdominal   Peds  Hematology   Anesthesia Other Findings   Reproductive/Obstetrics                           Anesthesia Physical Anesthesia Plan  ASA: 2  Anesthesia Plan: General   Post-op Pain Management:    Induction: Intravenous  PONV Risk Score and Plan:   Airway Management Planned: Natural Airway and Nasal Cannula  Additional Equipment:   Intra-op Plan:   Post-operative Plan:   Informed Consent: I have reviewed the patients History and Physical, chart, labs and discussed the procedure including the risks, benefits and alternatives for the proposed anesthesia with the patient or authorized representative who has indicated his/her understanding and acceptance.     Dental Advisory Given  Plan Discussed with: Anesthesiologist, CRNA and Surgeon  Anesthesia Plan Comments: (Patient consented for risks of anesthesia including but not limited to:  - adverse reactions to medications - risk of airway placement if required - damage to eyes, teeth, lips or other oral mucosa - nerve damage due to positioning  - sore throat or hoarseness - Damage to heart, brain, nerves, lungs, other parts of body or loss of life  Patient voiced understanding.)        Anesthesia Quick Evaluation

## 2021-03-22 ENCOUNTER — Encounter
Admission: RE | Disposition: A | Discharge status: Home / Self Care | Payer: Self-pay | Source: Home / Self Care | Attending: Gastroenterology

## 2021-03-22 ENCOUNTER — Ambulatory Visit: Payer: Medicare HMO | Admitting: Anesthesiology

## 2021-03-22 ENCOUNTER — Ambulatory Visit
Admission: RE | Admit: 2021-03-22 | Discharge: 2021-03-22 | Disposition: A | Discharge status: Home / Self Care | Payer: Medicare HMO | Attending: Gastroenterology | Admitting: Gastroenterology

## 2021-03-22 ENCOUNTER — Encounter: Payer: Self-pay | Admitting: Gastroenterology

## 2021-03-22 DIAGNOSIS — K635 Polyp of colon: Secondary | ICD-10-CM

## 2021-03-22 DIAGNOSIS — R195 Other fecal abnormalities: Secondary | ICD-10-CM

## 2021-03-22 DIAGNOSIS — F17200 Nicotine dependence, unspecified, uncomplicated: Secondary | ICD-10-CM | POA: Diagnosis not present

## 2021-03-22 DIAGNOSIS — K5790 Diverticulosis of intestine, part unspecified, without perforation or abscess without bleeding: Secondary | ICD-10-CM | POA: Diagnosis not present

## 2021-03-22 DIAGNOSIS — R69 Illness, unspecified: Secondary | ICD-10-CM | POA: Diagnosis not present

## 2021-03-22 DIAGNOSIS — Z1211 Encounter for screening for malignant neoplasm of colon: Secondary | ICD-10-CM

## 2021-03-22 DIAGNOSIS — K649 Unspecified hemorrhoids: Secondary | ICD-10-CM | POA: Diagnosis not present

## 2021-03-22 DIAGNOSIS — K5730 Diverticulosis of large intestine without perforation or abscess without bleeding: Secondary | ICD-10-CM | POA: Diagnosis not present

## 2021-03-22 DIAGNOSIS — K644 Residual hemorrhoidal skin tags: Secondary | ICD-10-CM | POA: Insufficient documentation

## 2021-03-22 DIAGNOSIS — E039 Hypothyroidism, unspecified: Secondary | ICD-10-CM | POA: Diagnosis not present

## 2021-03-22 HISTORY — PX: COLONOSCOPY WITH PROPOFOL: SHX5780

## 2021-03-22 HISTORY — DX: Nonrheumatic mitral (valve) prolapse: I34.1

## 2021-03-22 SURGERY — COLONOSCOPY WITH PROPOFOL
Anesthesia: General

## 2021-03-22 MED ORDER — PROPOFOL 10 MG/ML IV BOLUS
INTRAVENOUS | Status: DC | PRN
  Administered 2021-03-22: 60 mg via INTRAVENOUS

## 2021-03-22 MED ORDER — LIDOCAINE HCL (CARDIAC) PF 100 MG/5ML IV SOSY
PREFILLED_SYRINGE | INTRAVENOUS | Status: DC | PRN
  Administered 2021-03-22: 100 mg via INTRAVENOUS

## 2021-03-22 MED ORDER — PROPOFOL 10 MG/ML IV BOLUS
INTRAVENOUS | Status: AC
  Filled 2021-03-22: qty 20

## 2021-03-22 MED ORDER — PROPOFOL 500 MG/50ML IV EMUL
INTRAVENOUS | Status: DC | PRN
Start: 2021-03-22 — End: 2021-03-22
  Administered 2021-03-22: 150 ug/kg/min via INTRAVENOUS

## 2021-03-22 MED ORDER — SODIUM CHLORIDE 0.9 % IV SOLN: INTRAVENOUS | Status: DC

## 2021-03-22 MED ORDER — DEXMEDETOMIDINE HCL 200 MCG/2ML IV SOLN
INTRAVENOUS | Status: DC | PRN
  Administered 2021-03-22: 12 ug via INTRAVENOUS

## 2021-03-22 MED ORDER — PHENYLEPHRINE HCL (PRESSORS) 10 MG/ML IV SOLN
INTRAVENOUS | Status: DC | PRN
  Administered 2021-03-22 (×2): 160 ug via INTRAVENOUS

## 2021-03-22 NOTE — Anesthesia Postprocedure Evaluation (Signed)
Anesthesia Post Note  Patient: Deborah Price  Procedure(s) Performed: COLONOSCOPY WITH PROPOFOL  Anesthesia Type: General Anesthetic complications: no   No notable events documented.   Last Vitals:  Vitals:   03/22/21 1040 03/22/21 1153  BP: 131/87 104/72  Pulse: 62 68  Resp: 16 12  Temp: (!) 36.1 C (!) 36.1 C  SpO2: 100% 100%    Last Pain:  Vitals:   03/22/21 1153  TempSrc: Temporal  PainSc: 0-No pain                 Jerlyn Ly

## 2021-03-22 NOTE — OR Nursing (Signed)
Attempted to have bm to retrieve polyp, unable to retrieve, sent patient home with sterile container with saline and a hat. Patient told if able to retrieve polyp to come to clinic lab in [facility] mall by 8pm tonight.

## 2021-03-22 NOTE — Transfer of Care (Signed)
Immediate Anesthesia Transfer of Care Note  Patient: Deborah Price  Procedure(s) Performed: COLONOSCOPY WITH PROPOFOL  Patient Location: PACU and Endoscopy Unit  Anesthesia Type:General  Level of Consciousness: drowsy and patient cooperative  Airway & Oxygen Therapy: Patient Spontanous Breathing  Post-op Assessment: Report given to RN  Post vital signs: Reviewed and stable  Last Vitals:  Vitals Value Taken Time  BP 104/72 03/22/21 1153  Temp    Pulse 63 03/22/21 1154  Resp 14 03/22/21 1154  SpO2 99 % 03/22/21 1154  Vitals shown include unvalidated device data.  Last Pain:  Vitals:   03/22/21 1040  TempSrc: Temporal  PainSc: 0-No pain         Complications: No notable events documented.

## 2021-03-22 NOTE — Op Note (Signed)
[facility] Gastroenterology Patient Name: Deborah Price Procedure Date: 03/22/2021 10:59 AM MRN: [id number] Account #: [id number] Date of Birth: 08/12/1950 Admit Type: Outpatient Age: 71 Room: [facility] ENDO ROOM 2 Gender: Female Note Status: Finalized Instrument Name: Colonoscope [id number] Procedure:             Colonoscopy Indications:           This is the patient's first colonoscopy, Positive                         fecal immunochemical test Providers:             Lin Landsman MD, MD Referring MD:          Venia Carbon (Referring MD) Medicines:             Monitored Anesthesia Care Complications:         No immediate complications. Estimated blood loss: None. Procedure:             Pre-Anesthesia Assessment:                        - Prior to the procedure, a History and Physical was                         performed, and patient medications and allergies were                         reviewed. The patient is competent. The risks and                         benefits of the procedure and the sedation options and                         risks were discussed with the patient. All questions                         were answered and informed consent was obtained.                         Patient identification and proposed procedure were                         verified by the physician, the nurse, the                         anesthesiologist, the anesthetist and the technician                         in the pre-procedure area in the procedure room in the                         endoscopy suite. Mental Status Examination: alert and                         oriented. Airway Examination: normal oropharyngeal                         airway and neck mobility. Respiratory Examination:  clear to auscultation. CV Examination: normal.                         Prophylactic Antibiotics: The patient does not require                         prophylactic  antibiotics. Prior Anticoagulants: The                         patient has taken no previous anticoagulant or                         antiplatelet agents. ASA Grade Assessment: III - A                         patient with severe systemic disease. After reviewing                         the risks and benefits, the patient was deemed in                         satisfactory condition to undergo the procedure. The                         anesthesia plan was to use general anesthesia.                         Immediately prior to administration of medications,                         the patient was re-assessed for adequacy to receive                         sedatives. The heart rate, respiratory rate, oxygen                         saturations, blood pressure, adequacy of pulmonary                         ventilation, and response to care were monitored                         throughout the procedure. The physical status of the                         patient was re-assessed after the procedure.                        After obtaining informed consent, the colonoscope was                         passed under direct vision. Throughout the procedure,                         the patient's blood pressure, pulse, and oxygen                         saturations were monitored continuously. The  Colonoscope was introduced through the anus and                         advanced to the the terminal ileum, with                         identification of the appendiceal orifice and IC                         valve. The colonoscopy was extremely difficult due to                         multiple diverticula in the colon. Successful                         completion of the procedure was aided by withdrawing                         the scope and replacing with the pediatric                         colonoscope. The patient tolerated the procedure well.                         The quality of  the bowel preparation was evaluated                         using the BBPS [facility] Bowel Preparation Scale) with                         scores of: Right Colon = 3, Transverse Colon = 3 and                         Left Colon = 3 (entire mucosa seen well with no                         residual staining, small fragments of stool or opaque                         liquid). The total BBPS score equals 9. Findings:      The perianal and digital rectal examinations were normal. Pertinent       negatives include normal sphincter tone and no palpable rectal lesions.      A 5 mm polyp was found in the sigmoid colon. The polyp was sessile. The       polyp was removed with a cold snare. Resection and retrieval were       complete.      A 10 mm polyp was found in the recto-sigmoid colon. The polyp was       sessile. The polyp was removed with a hot snare. Polyp resection was       incomplete, and the resected tissue was not retrieved.      Multiple small and large-mouthed diverticula were found in the       recto-sigmoid colon and sigmoid colon. There was narrowing of the colon       in association with the diverticular opening. Peri-diverticular erythema       was seen.  There was no evidence of diverticular bleeding.      Non-bleeding external hemorrhoids were found during retroflexion. The       hemorrhoids were medium-sized. Impression:            - One 5 mm polyp in the sigmoid colon, removed with a                         cold snare. Resected and retrieved.                        - One 10 mm polyp at the recto-sigmoid colon, removed                         with a hot snare. Incomplete resection. Resected                         tissue not retrieved.                        - Severe diverticulosis in the recto-sigmoid colon and                         in the sigmoid colon. There was narrowing of the colon                         in association with the diverticular opening.                          Peri-diverticular erythema was seen. There was no                         evidence of diverticular bleeding.                        - Non-bleeding external hemorrhoids. Recommendation:        - Discharge patient to home (with escort).                        - Resume previous diet today.                        - Continue present medications.                        - Await pathology results.                        - Repeat colonoscopy in 7-10 years for surveillance                         based on pathology results. Procedure Code(s):     --- Professional ---                        [phone], Colonoscopy, flexible; with removal of                         tumor(s), polyp(s), or other lesion(s) by snare  technique Diagnosis Code(s):     --- Professional ---                        K63.5, Polyp of colon                        K64.4, Residual hemorrhoidal skin tags                        R19.5, Other fecal abnormalities                        K57.30, Diverticulosis of large intestine without                         perforation or abscess without bleeding CPT copyright 2019 American Medical Association. All rights reserved. The codes documented in this report are preliminary and upon coder review may  be revised to meet current compliance requirements. Dr. Ulyess Mort Lin Landsman MD, MD 03/22/2021 11:50:44 AM This report has been signed electronically. Number of Addenda: 0 Note Initiated On: 03/22/2021 10:59 AM Scope Withdrawal Time: 0 hours 19 minutes 52 seconds  Total Procedure Duration: 0 hours 41 minutes 44 seconds  Estimated Blood Loss:  Estimated blood loss: none.      [facility]

## 2021-03-22 NOTE — H&P (Signed)
Arlyss Repress, MD [address]  [address]  [place], [place] [address]  Main: [phone]  Fax: [phone] Pager: [phone]  Primary Care Physician:  Karie Schwalbe, MD Primary Gastroenterologist:  Dr. Arlyss Repress  Pre-Procedure History & Physical: HPI:  Deborah Price is a 71 y.o. female is here for an colonoscopy.   Past Medical History:  Diagnosis Date   Allergy    Mitral valve prolapse    Thyroid disease     Past Surgical History:  Procedure Laterality Date   ABDOMINAL HYSTERECTOMY  1996    Prior to Admission medications   Medication Sig Start Date End Date Taking? Authorizing Provider  levothyroxine (SYNTHROID) 137 MCG tablet TAKE 1 TABLET (137 MCG TOTAL) BY MOUTH DAILY BEFORE BREAKFAST. (NOTE NEW DOSE) 08/14/20   Tillman Abide I, MD  MULTIPLE VITAMIN PO Take 1 tablet by mouth daily.    [provider]    Allergies as of 02/25/2021 - Review Complete 02/12/2021  Allergen Reaction Noted   Ciprofloxacin Other (See Comments) 08/08/2016   Codeine Nausea And Vomiting 10/09/2014   Penicillins Hives 10/09/2014    Family History  Problem Relation Age of Onset   Hypertension Mother    Dementia Mother    Bladder Cancer Neg Hx    Kidney cancer Neg Hx    Heart disease Neg Hx    Cancer Neg Hx     Social History   Socioeconomic History   Marital status: Single    Spouse name: Not on file   Number of children: 0   Years of education: Not on file   Highest education level: Master's degree (e.g., MA, MS, MEng, MEd, MSW, MBA)  Occupational History   Occupation: Emergency planning/management officer: [employer]    Comment: Adjunct and private  Tobacco Use   Smoking status: Some Days    Packs/day: 0.50    Years: 30.00    Pack years: 15.00    Types: Cigarettes    Start date: 02/05/1985   Smokeless tobacco: Never   Tobacco comments:    4 cigarettes a day  Vaping Use   Vaping Use: Never used  Substance and Sexual Activity   Alcohol use:  Yes    Alcohol/week: 0.0 standard drinks    Comment: Occasional-wine on weekends   Drug use: No   Sexual activity: Not Currently  Other Topics Concern   Not on file  Social History Narrative   Patient is a Dentist at [employer]      No living will   [employer] [name] is health care POA   Would accept resuscitation attempts   Not sure about tube feeds   Social Determinants of Health   Financial Resource Strain: Not on file  Food Insecurity: Not on file  Transportation Needs: Not on file  Physical Activity: Not on file  Stress: Not on file  Social Connections: Not on file  Intimate Partner Violence: Not on file    Review of Systems: See HPI, otherwise negative ROS  Physical Exam: BP 131/87    Pulse 62    Temp (!) 97 F (36.1 C) (Temporal)    Resp 16    Ht 5\' 7"  (1.702 m)    Wt 61.2 kg    SpO2 100%    BMI 21.14 kg/m  General:   Alert,  pleasant and cooperative in NAD Head:  Normocephalic and atraumatic. Neck:  Supple; no masses or thyromegaly. Lungs:  Clear throughout  to auscultation.    Heart:  Regular rate and rhythm. Abdomen:  Soft, nontender and nondistended. Normal bowel sounds, without guarding, and without rebound.   Neurologic:  Alert and  oriented x4;  grossly normal neurologically.  Impression/Plan: [name] is here for an colonoscopy to be performed for FIT positive test  Risks, benefits, limitations, and alternatives regarding  colonoscopy have been reviewed with the patient.  Questions have been answered.  All parties agreeable.   [name], MD  03/22/2021, 10:58 AM

## 2021-03-25 ENCOUNTER — Encounter: Payer: Self-pay | Admitting: Gastroenterology

## 2021-03-25 LAB — SURGICAL PATHOLOGY

## 2021-05-23 ENCOUNTER — Telehealth (INDEPENDENT_AMBULATORY_CARE_PROVIDER_SITE_OTHER): Payer: Medicare HMO | Admitting: Family

## 2021-05-23 ENCOUNTER — Other Ambulatory Visit: Payer: Self-pay

## 2021-05-23 ENCOUNTER — Encounter: Payer: Self-pay | Admitting: Family

## 2021-05-23 VITALS — Temp 98.2°F | Ht 68.0 in | Wt 132.0 lb

## 2021-05-23 DIAGNOSIS — J0110 Acute frontal sinusitis, unspecified: Secondary | ICD-10-CM | POA: Diagnosis not present

## 2021-05-23 DIAGNOSIS — R051 Acute cough: Secondary | ICD-10-CM | POA: Insufficient documentation

## 2021-05-23 MED ORDER — BENZONATATE 200 MG PO CAPS
200.0000 mg | ORAL_CAPSULE | Freq: Three times a day (TID) | ORAL | 0 refills | Status: DC | PRN
Start: 2021-05-23 — End: 2021-09-20

## 2021-05-23 MED ORDER — DOXYCYCLINE HYCLATE 100 MG PO CAPS: 100.0000 mg | ORAL_CAPSULE | Freq: Two times a day (BID) | ORAL | 0 refills | Status: AC

## 2021-05-23 NOTE — Patient Instructions (Signed)
Start prescription of doxycycline 100 mg twice daily for ten days and take as prescribed.  ?Tylenol/ibuprofen ok for sinus pain as needed ?Ok to conitnue with mucinex fast max as well as delsyn at night.  ?I have sent in a prescription for tessalon perrles as well that will help for cough during the day. ?Increase oral fluids. Ok to continue with humidifers and hot steamy showers as discussed during visit. ? ?It was a pleasure speaking with you today, I hope you start feeling better soon. ? ?Regards,  ? ?Kwane Rohl ? ? ?

## 2021-05-23 NOTE — Assessment & Plan Note (Addendum)
Prescription given for doxycycline 100 mg po bid for ten days. Pt to continue tylenol/ibuprofen prn sinus pain. Continue with humidifier prn and steam showers recommended as well. instructed If no symptom improvement in 48 hours please f/u ?Continue with mucinex and delsyn at night  ? ?

## 2021-05-23 NOTE — Assessment & Plan Note (Signed)
Tessalon peerles for cough in day and delsym ok at night ?

## 2021-05-23 NOTE — Progress Notes (Signed)
? ? ?MyChart Video Visit ? ? ? ?Virtual Visit via Video Note  ? ?This visit type was conducted due to national recommendations for restrictions regarding the COVID-19 Pandemic (e.g. social distancing) in an effort to limit this patient's exposure and mitigate transmission in our community. This patient is at least at moderate risk for complications without adequate follow up. This format is felt to be most appropriate for this patient at this time. Physical exam was limited by quality of the video and audio technology used for the visit. CMA was able to get the patient set up on a video visit. ? ?Patient location: Home. Patient and provider in visit ?Provider location: Office ? ?I discussed the limitations of evaluation and management by telemedicine and the availability of in person appointments. The patient expressed understanding and agreed to proceed. ? ?Visit Date: 05/23/2021 ? ?Today's healthcare provider: Mort Sawyers, FNP  ? ? ? ?Subjective:  ? ? Patient ID: Deborah Price, female    DOB: [birth year], 71 y.o.   MRN: [id number] ? ?Chief Complaint  ?Patient presents with  ? Nasal Congestion  ?  C/o sinus congestion, nasal drainage and cough.  Started about 2 wks ago.  Tried Mucinex Severe Congestion/Cough.   ? ? ?HPI ? ?Pt here with c/o two weeks of symptoms.  ?Two saturdays ago woke up with nasal congestion which has since progressed , sinus pressure, chest congestion, bil ear fullness, and a cough yellow green mucous that is persistent throughout the night.  ? 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 She props up the pillow at night with some relief of the coughing at night.  ? ?Has tried mucinex severe cold and cough with some relief.  ?She denies sob, fever or chills. No sore throat.  ? ?Past Medical History:  ?Diagnosis Date  ? Allergy   ? Mitral valve prolapse   ? Thyroid disease    ? ? ?Past Surgical History:  ?Procedure Laterality Date  ? ABDOMINAL HYSTERECTOMY  1996  ? COLONOSCOPY WITH PROPOFOL N/A 03/22/2021  ? Procedure: COLONOSCOPY WITH PROPOFOL;  Surgeon: Toney Reil, MD;  Location: [facility] ENDOSCOPY;  Service: Gastroenterology;  Laterality: N/A;  ? ? ?Family History  ?Problem Relation Age of Onset  ? Hypertension Mother   ? Dementia Mother   ? Bladder Cancer Neg Hx   ? Kidney cancer Neg Hx   ? Heart disease Neg Hx   ? Cancer Neg Hx   ? ? ?Social History  ? ?Socioeconomic History  ? Marital status: Single  ?  Spouse name: Not on file  ? Number of children: 0  ? Years of education: Not on file  ? Highest education level: Master's degree (e.g., MA, MS, MEng, MEd, MSW, MBA)  ?Occupational History  ? Occupation: Scientist, water quality  ?  Employer: [employer]  ?  Comment: Adjunct and private  ?Tobacco Use  ? Smoking status: Some Days  ?  Packs/day: 0.50  ?  Years: 30.00  ?  Pack years: 15.00  ?  Types: Cigarettes  ?  Start date: 02/05/1985  ? Smokeless tobacco: Never  ? Tobacco comments:  ?  4 cigarettes a day  ?Vaping Use  ? Vaping Use: Never used  ?Substance and Sexual Activity  ? Alcohol use: Yes  ?  Alcohol/week: 0.0 standard drinks  ?  Comment: Occasional-wine on weekends  ?  Drug use: No  ? Sexual activity: Not Currently  ?Other Topics Concern  ? Not on file  ?Social History Narrative  ? Patient is a Dentist at [employer]  ?   ? No living will  ? Nephew Harlie Buening is health care POA  ? Would accept resuscitation attempts  ? Not sure about tube feeds  ? ?Social Determinants of Health  ? ?Financial Resource Strain: Not on file  ?Food Insecurity: Not on file  ?Transportation Needs: Not on file  ?Physical Activity: Not on file  ?Stress: Not on file  ?Social Connections: Not on file  ?Intimate Partner Violence: Not on file  ? ? ?Outpatient Medications Prior to Visit  ?Medication Sig Dispense Refill  ? Cholecalciferol (VITAMIN D3) 50 MCG (2000 UT) TABS Take 1 tablet by mouth  daily.    ? levothyroxine (SYNTHROID) 137 MCG tablet TAKE 1 TABLET (137 MCG TOTAL) BY MOUTH DAILY BEFORE BREAKFAST. (NOTE NEW DOSE) 90 tablet 3  ? MULTIPLE VITAMIN PO Take 1 tablet by mouth daily.    ? vitamin B-12 (CYANOCOBALAMIN) 500 MCG tablet Take 500 mcg by mouth daily.    ? ?No facility-administered medications prior to visit.  ? ? ?Allergies  ?Allergen Reactions  ? Ciprofloxacin Other (See Comments)  ?  Possible tendon problems  ? Codeine Nausea And Vomiting  ? Penicillins Hives  ? ? ?Review of Systems  ?Constitutional:  Negative for chills and fever.  ?HENT:  Positive for congestion, ear pain (bil ear fullness) and sinus pressure. Negative for sore throat.   ?Respiratory:  Positive for cough (productive with green yellow sputum). Negative for shortness of breath and wheezing.   ?Cardiovascular:  Negative for chest pain and palpitations.  ? ?   ?Objective:  ?  ?Physical Exam ?Constitutional:   ?   Appearance: Normal appearance. She is normal weight.  ?Pulmonary:  ?   Effort: Pulmonary effort is normal.  ?Neurological:  ?   Mental Status: She is alert.  ? ? ?Temp 98.2 ?F (36.8 ?C)   Ht 5\' 8"  (1.727 m)   Wt 132 lb (59.9 kg)   BMI 20.07 kg/m?  ?Wt Readings from Last 3 Encounters:  ?05/23/21 132 lb (59.9 kg)  ?03/22/21 135 lb (61.2 kg)  ?02/12/21 133 lb 4 oz (60.4 kg)  ? ? ?   ?Assessment & Plan:  ? ?Problem List Items Addressed This Visit   ? ?  ? Respiratory  ? Acute non-recurrent frontal sinusitis  ?  Prescription given for doxycycline 100 mg po bid for ten days. Pt to continue tylenol/ibuprofen prn sinus pain. Continue with humidifier prn and steam showers recommended as well. instructed If no symptom improvement in 48 hours please f/u ?Continue with mucinex and delsyn at night  ? ?  ?  ? Relevant Medications  ? doxycycline (VIBRAMYCIN) 100 MG capsule  ? benzonatate (TESSALON) 200 MG capsule  ?  ? Other  ? Acute cough - Primary  ?  Tessalon peerles for cough in day and delsym ok at night ?  ?  ? Relevant  Medications  ? benzonatate (TESSALON) 200 MG capsule  ? ? ?I am having Deborah Price start on doxycycline and benzonatate. I am also having Deborah Price maintain Deborah Price MULTIPLE VITAMIN PO, levothyroxine, vitamin B-12, and Vitamin D3. ? ?Meds ordered this encounter  ?Medications  ? doxycycline (VIBRAMYCIN) 100 MG capsule  ?  Sig: Take 1 capsule (100 mg total) by mouth 2 (two) times daily for 10 days.  ?  Dispense:  20 capsule  ?  Refill:  0  ?  Order Specific Question:   Supervising Provider  ?  Answer:   BEDSOLE, AMY E [[id number]]  ? benzonatate (TESSALON) 200 MG capsule  ?  Sig: Take 1 capsule (200 mg total) by mouth 3 (three) times daily as needed for cough.  ?  Dispense:  20 capsule  ?  Refill:  0  ?  Order Specific Question:   Supervising Provider  ?  Answer:   BEDSOLE, AMY E [[id number]]  ? ? ?I discussed the assessment and treatment plan with the patient. The patient was provided an opportunity to ask questions and all were answered. The patient agreed with the plan and demonstrated an understanding of the instructions. ?  ?The patient was advised to call back or seek an in-person evaluation if the symptoms worsen or if the condition fails to improve as anticipated. ? ?I provided 15 minutes of face-to-face time during this encounter. ? ? ?Mort Sawyersabitha Apryll Hinkle, FNP ?Nature conservation officerLeBauer Health[facility] at [facility] ?[phone] (phone) ?[phone] (fax) ? ?[facility]  ?

## 2021-06-18 ENCOUNTER — Telehealth: Payer: Self-pay

## 2021-06-18 NOTE — Telephone Encounter (Signed)
[facility] - Client ?TELEPHONE ADVICE RECORD ?AccessNurse? ?Patient ?Name: ?Deborah BO ?Price ?Gender: Female ?DOB: August 21, 1950 ?Age: 71 Y 2 M ?Return ?Phone ?Number: ?[id number] ?(Primary) ?Address: ?City/ ?State/ ?Zip: [place] ? [address] ?Client [facility] - Client ?Client Site [facility] ?Provider Viviana Simpler- MD ?Contact Type Call ?Who Is Calling Patient / Member / Family / Caregiver ?Call Type Triage / Clinical ?Relationship To Patient Self ?Return Phone Number [phone] (Primary) ?Chief Complaint Vomiting ?Reason for Call Symptomatic / Request for Health Information ?Initial Comment Caller states that she is transferring patient for ?triage who may have food poisoning. She is ?vomiting and having diarrhea. Negative for ?COVID. She has urinated within the last 8 hours ?and no blood in vomit. ?Translation No ?Nurse Assessment ?Nurse: Linward Headland, RN, [place] Date/Time (Eastern Time): 06/18/2021 12:14:39 PM ?Confirm and document reason for call. If ?symptomatic, describe symptoms. ?---Caller states she has vomiting and diarrhea that ?began this morning after getting some chili from a fast ?food location. States this morning around 6 am she ?began vomiting. She also has pressure in her head, no ?pain, she is experiencing fatigue as well. States she ?has had loose stool several times but not watery stool. ?She is also having gas. Denies any abdominal pains ?currently. Denies fever. ?Does the patient have any new or worsening ?symptoms? ---Yes ?Will a triage be completed? ---Yes ?Related visit to physician within the last 2 weeks? ---N/A ?Does the PT have any chronic conditions? (i.e. ?diabetes, asthma, this includes High risk factors for ?pregnancy, etc.) ?---Yes ?List chronic conditions. ---thyroid disease ?Is this a behavioral health or substance abuse call? ---No ?Guidelines ?Guideline Title Affirmed Question Affirmed Notes Nurse  Date/Time (Eastern ?Time) ?Diarrhea MILD-MODERATE ?diarrhea (e.g., 1-6 ?Linward Headland, [place], [place] 06/18/2021 12:18:58 ?PM ?PLEASE NOTE: All timestamps contained within this report are represented as [place] Standard Time. ?CONFIDENTIALTY NOTICE: This fax transmission is intended only for the addressee. It contains information that is legally privileged, confidential or ?otherwise protected from use or disclosure. If you are not the intended recipient, you are strictly prohibited from reviewing, disclosing, copying using ?or disseminating any of this information or taking any action in reliance on or regarding this information. If you have received this fax in error, please ?notify us immediately by telephone so that we can arrange for its return to [place]. Phone: [phone], Toll-Free: [phone], Fax: [phone] ?Page: 2 of 2 ?Call Id: [id number] ?Guidelines ?Guideline Title Affirmed Question Affirmed Notes Nurse Date/Time (Eastern ?Time) ?times / day more than ?normal) ?Disp. Time (Eastern ?Time) Disposition Final User ?06/18/2021 12:28:10 [place], RN, [place] ?Caller Disagree/Comply Comply ?Caller Understands Yes ?PreDisposition Call Doctor ?Care Advice Given Per Guideline ?HOME CARE: * You should be able to treat this at home. REASSURANCE AND EDUCATION - DIARRHEA: * Diarrhea may ?caused by a virus ('stomach flu') or a bacteria. Diarrhea is one of the body's way of getting rid of germs. * Certain foods (e.g., dairy ?products, supplements like Ensure) can also trigger diarrhea. FLUID THERAPY DURING MILD TO MODERATE DIARRHEA: ?* Drink more fluids, at least 8 to 10 cups daily. One cup equals 8 oz (240 ml). * WATER: For mild to moderate diarrhea, water is ?often the best liquid to drink. You should also eat some salty foods (e.g., potato chips, pretzels, saltine crackers). This is important to ?make sure you are getting enough salt, sugars, and fluids to meet your body's needs. FOOD AND NUTRITION  DURING MILD  TO ?MODERATE DIARRHEA: * Begin with boiled starches / cereals (e.g., potatoes, rice, noodles, wheat, oats) with a small amount of ?salt to taste. * You can also eat bananas, yogurt, crackers, soup. CALL BACK IF: * Signs of dehydration occur (e.g., no urine over ?12 hours, very dry mouth, lightheaded, etc.) * Diarrhea lasts over 7 days * You become worse CARE ADVICE given per Diarrhea ?(Adult) guideline. EXPECTED COURSE: * Viral diarrhea lasts 4 to 7 days. * It is usually worse on days 1 and 2. ?Comments ?User: Jodelle Green, RN Date/Time Eilene Ghazi Time): 06/18/2021 12:18:38 PM ?Recently took Doxycycline for sinus infectio ?

## 2021-06-18 NOTE — Telephone Encounter (Signed)
Agree with that advisement, continue to monitor symptoms  ?

## 2021-06-18 NOTE — Telephone Encounter (Signed)
I spoke with pt and she had spoken with access nurse and was advised what to do and given advice when to cb to be seen if needed. UC & ED precautions also given and pt voiced  understanding. Pt said she thinks she is feeling better now and will continue to monitor her condition. Sending note to Dr Alphonsus Sias who is out of office this afternoon and Dr Milinda Antis who is in office and [facility] CMA. ?

## 2021-06-19 NOTE — Telephone Encounter (Signed)
Left message to see how pt was feeling today. ?

## 2021-06-25 NOTE — Telephone Encounter (Signed)
That is good to hear 

## 2021-06-25 NOTE — Telephone Encounter (Signed)
Called to follow-up on patient and was advised that she is feeling so much better. Patient stated that she is no longer having any symptoms. Advised patient if her symptoms return to call the office and let Dr. Alphonsus Sias know. ?Patient stated that she really appreciated the call. ?

## 2021-08-14 ENCOUNTER — Ambulatory Visit: Payer: Medicare HMO | Admitting: Family Medicine

## 2021-08-17 ENCOUNTER — Other Ambulatory Visit: Payer: Self-pay | Admitting: Internal Medicine

## 2021-09-20 ENCOUNTER — Encounter: Payer: Self-pay | Admitting: Nurse Practitioner

## 2021-09-20 ENCOUNTER — Ambulatory Visit (INDEPENDENT_AMBULATORY_CARE_PROVIDER_SITE_OTHER): Payer: Medicare HMO | Admitting: Nurse Practitioner

## 2021-09-20 VITALS — BP 100/70 | HR 85 | Temp 98.3°F | Resp 16 | Ht 68.0 in | Wt 138.0 lb

## 2021-09-20 DIAGNOSIS — R21 Rash and other nonspecific skin eruption: Secondary | ICD-10-CM | POA: Diagnosis not present

## 2021-09-20 MED ORDER — TRIAMCINOLONE ACETONIDE 0.1 % EX CREA: 1.0000 | TOPICAL_CREAM | Freq: Two times a day (BID) | CUTANEOUS | 0 refills | Status: DC

## 2021-09-20 MED ORDER — METHYLPREDNISOLONE ACETATE 40 MG/ML IJ SUSP
40.0000 mg | Freq: Once | INTRAMUSCULAR | Status: AC
  Administered 2021-09-20: 40 mg via INTRAMUSCULAR

## 2021-09-20 NOTE — Progress Notes (Signed)
   Acute Office Visit  Subjective:     Patient ID: Deborah Price, female    DOB: 03-14-1951, 71 y.o.   MRN: [id number]  Chief Complaint  Patient presents with   Rash    Rash on hands x 1.5      Patient is in today for Rash  Started approx 1.5 two two weeks ago. States started on the left hand. States that it has progressed slowly. States that it does itch and does hurt 2/10. States worse with pressure. States that she did use a new cleaning product for the kitchen and has used it several times, without wearing gloves.  States that she has tried over the counter Cortizone that did help with the itching. Described as a subtle itch and burn  No sick contacts that she is aware of and no one around her has similar rash or symptoms   Review of Systems  Constitutional:  Negative for chills and fever.  Skin:  Positive for itching and rash.  Neurological:  Negative for headaches.        Objective:    BP 100/70   Pulse 85   Temp 98.3 F (36.8 C)   Resp 16   Ht 5\' 8"  (1.727 m)   Wt 138 lb (62.6 kg)   SpO2 98%   BMI 20.98 kg/m    Physical Exam Vitals and nursing note reviewed.  Constitutional:      Appearance: Normal appearance.  Skin:    Findings: Erythema, lesion and rash present.          Comments: See clinical photo.  Diffuse macular/papular rash to bilateral palmer surface. None on dorsum of hands  Neurological:     Mental Status: She is alert.      No results found for any visits on 09/20/21.      Assessment & Plan:   Problem List Items Addressed This Visit       Musculoskeletal and Integument   Rash - Primary    Administer 40 mg Depo-Medrol IM in office.  Patient also written prescription for triamcinolone cream that she used twice a day for no more than 7 days.  Did review steroid precautions with patient in regards to skin atrophy and discoloration of skin.  Patient will follow-up if no improvement did recommend that she start wearing gloves when  using cleaning supplies around the house.      Relevant Medications   triamcinolone cream (KENALOG) 0.1 %    Meds ordered this encounter  Medications   triamcinolone cream (KENALOG) 0.1 %    Sig: Apply 1 Application topically 2 (two) times daily. Use no longer than 7 days in a row    Dispense:  30 g    Refill:  0    Order Specific Question:   Supervising Provider    Answer:   11/21/21 A [[id number]]   methylPREDNISolone acetate (DEPO-MEDROL) injection 40 mg    Return if symptoms worsen or fail to improve.  Roxy Manns, NP

## 2021-09-20 NOTE — Patient Instructions (Signed)
Nice to see you today Gave you a steroid injection in office Sent a cream to use twice a day for no more than a week with out letting us know Follow up if no improvement

## 2021-09-20 NOTE — Assessment & Plan Note (Signed)
Administer 40 mg Depo-Medrol IM in office.  Patient also written prescription for triamcinolone cream that she used twice a day for no more than 7 days.  Did review steroid precautions with patient in regards to skin atrophy and discoloration of skin.  Patient will follow-up if no improvement did recommend that she start wearing gloves when using cleaning supplies around the house.

## 2021-10-04 ENCOUNTER — Ambulatory Visit (INDEPENDENT_AMBULATORY_CARE_PROVIDER_SITE_OTHER): Payer: Medicare HMO | Admitting: Nurse Practitioner

## 2021-10-04 VITALS — BP 104/80 | HR 103 | Temp 97.0°F | Resp 10 | Ht 68.0 in | Wt 138.0 lb

## 2021-10-04 DIAGNOSIS — L853 Xerosis cutis: Secondary | ICD-10-CM | POA: Diagnosis not present

## 2021-10-04 DIAGNOSIS — R234 Changes in skin texture: Secondary | ICD-10-CM | POA: Insufficient documentation

## 2021-10-04 NOTE — Assessment & Plan Note (Signed)
Inform her to quit using caustic things such as hand sanitizer's.  Also do not use steroid cream for now.  Informed her to try to keep hand hydrated like this will self resolve.  Patient was notified of this and informed to follow-up with PCP if no improvement

## 2021-10-04 NOTE — Assessment & Plan Note (Signed)
Left plantar surface of foot.  Inform patient to try to keep foot moisturized did recommend using Vaseline at night covered with a sock prior to bed to see if this will help rehydrate the skin on the bottom of the foot.  Follow-up if no improvement

## 2021-10-04 NOTE — Patient Instructions (Signed)
Nice to see you today Continue to monitor the skin. Keep it moisturized. You can use Vaseline on the foot at night and that should help. Follow up with Dr. Alphonsus Sias if you do not improve

## 2021-10-04 NOTE — Progress Notes (Signed)
   Acute Office Visit  Subjective:     Patient ID: Deborah Price, female    DOB: [birth year], 71 y.o.   MRN: [id number]  Chief Complaint  Patient presents with   Skin Problem    Rash from the hands is gone but now has peeling of her hands. No pain or burning sensation of the fingers or hands. She now also has peeling on her left foot.    HPI Patient is in today for Rash  She was seen on  and given an injection and steroid cream States taht she used the cream, the rash seemed to disappear and then her palms started to peel. States on the left foot last week she noticed some peeling. States that she does not go barefoot during the house. 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   Review of Systems  Constitutional:  Negative for chills and fever.  Skin:  Positive for rash. Negative for itching.        Objective:    BP 104/80   Pulse (!) 103   Temp (!) 97 F (36.1 C) (Temporal)   Resp 10   Ht 5\' 8"  (1.727 m)   Wt 138 lb (62.6 kg)   SpO2 97%   BMI 20.98 kg/m    Physical Exam Musculoskeletal:       Feet:  Feet:     Comments: Peeling rough skin on the left plantar surface of foot.  Does not itch does not hurt.  Does not look fungal in nature. Skin:         Comments: There is a small skin peeling off.  No rash appreciated does not itch does not hurt     No results found for any visits on 10/04/21.      Assessment & Plan:   Problem List Items Addressed This Visit       Musculoskeletal and Integument   Dry skin - Primary    Left plantar surface of foot.  Inform patient to try to keep foot moisturized did recommend using Vaseline at night covered with a sock prior to bed to see if this will help rehydrate the skin on the bottom of the foot.  Follow-up if no improvement        Other   Peeling skin    Inform her to quit using caustic things such as hand sanitizer's.  Also do not use steroid cream for now.  Informed her to try to keep hand hydrated like  this will self resolve.  Patient was notified of this and informed to follow-up with PCP if no improvement       No orders of the defined types were placed in this encounter.   Return if symptoms worsen or fail to improve.  10/06/21, NP

## 2021-12-16 ENCOUNTER — Encounter: Payer: Self-pay | Admitting: Internal Medicine

## 2021-12-16 ENCOUNTER — Ambulatory Visit (INDEPENDENT_AMBULATORY_CARE_PROVIDER_SITE_OTHER): Payer: Medicare HMO | Admitting: Internal Medicine

## 2021-12-16 DIAGNOSIS — J069 Acute upper respiratory infection, unspecified: Secondary | ICD-10-CM | POA: Diagnosis not present

## 2021-12-16 DIAGNOSIS — J4531 Mild persistent asthma with (acute) exacerbation: Secondary | ICD-10-CM | POA: Insufficient documentation

## 2021-12-16 NOTE — Assessment & Plan Note (Signed)
COVID negative twice No evidence of secondary bacterial infection Discussed mucinex and tylenol Has left over benzonatate Discussed cigarette cessation If worsens towards the end of the week, would try empiric antibiotic

## 2021-12-16 NOTE — Progress Notes (Signed)
Subjective:    Patient ID: Deborah Price, female    DOB: 07/16/1950, 71 y.o.   MRN: [id number]  HPI Here due to respiratory infection  Felt cough coming on 3 days ago Then woke with sore throat--then it would come and go (worse when lying down) Sinus and chest congestion Sneezing throughout Tried mucinex severe---loosened things up some Woke last night---with feeling of congestion again  No fever--high was 99.4 No chills or sweats No SOB other than coughing Some wheezing in chest---still smoking some No ear pain or headache  Did try sudafed in day Did try 1 tylenol COVID test twice---negative  Current Outpatient Medications on File Prior to Visit  Medication Sig Dispense Refill   Cholecalciferol (VITAMIN D3) 50 MCG (2000 UT) TABS Take 1 tablet by mouth daily.     levothyroxine (SYNTHROID) 137 MCG tablet TAKE 1 TABLET (137 MCG TOTAL) BY MOUTH DAILY BEFORE BREAKFAST. (NOTE NEW DOSE) 90 tablet 1   MULTIPLE VITAMIN PO Take 1 tablet by mouth daily.     vitamin B-12 (CYANOCOBALAMIN) 500 MCG tablet Take 500 mcg by mouth daily.     No current facility-administered medications on file prior to visit.    Allergies  Allergen Reactions   Bee Venom    Ciprofloxacin Other (See Comments)    Possible tendon problems   Codeine Nausea And Vomiting   Penicillins Hives    Past Medical History:  Diagnosis Date   Allergy    Mitral valve prolapse    Thyroid disease     Past Surgical History:  Procedure Laterality Date   ABDOMINAL HYSTERECTOMY  1996   COLONOSCOPY WITH PROPOFOL N/A 03/22/2021   Procedure: COLONOSCOPY WITH PROPOFOL;  Surgeon: Toney Reil, MD;  Location: [facility] ENDOSCOPY;  Service: Gastroenterology;  Laterality: N/A;    Family History  Problem Relation Age of Onset   Hypertension Mother    Dementia Mother    Bladder Cancer Neg Hx    Kidney cancer Neg Hx    Heart disease Neg Hx    Cancer Neg Hx     Social History   Socioeconomic History   Marital  status: Single    Spouse name: Not on file   Number of children: 0   Years of education: Not on file   Highest education level: Master's degree (e.g., MA, MS, MEng, MEd, MSW, MBA)  Occupational History   Occupation: Emergency planning/management officer: [employer]    Comment: Adjunct and private  Tobacco Use   Smoking status: Some Days    Packs/day: 0.50    Years: 30.00    Total pack years: 15.00    Types: Cigarettes    Start date: 02/05/1985   Smokeless tobacco: Never   Tobacco comments:    4 cigarettes a day  Vaping Use   Vaping Use: Never used  Substance and Sexual Activity   Alcohol use: Yes    Alcohol/week: 0.0 standard drinks of alcohol    Comment: Occasional-wine on weekends   Drug use: No   Sexual activity: Not Currently  Other Topics Concern   Not on file  Social History Narrative   Patient is a Dentist at [employer]      No living will   [employer] [name] is health care POA   Would accept resuscitation attempts   Not sure about tube feeds   Social Determinants of Health   Financial Resource Strain: Low Risk  (05/07/2018)   Overall Financial Resource Strain (  CARDIA)    Difficulty of Paying Living Expenses: Not hard at all  Food Insecurity: No Food Insecurity (05/07/2018)   Hunger Vital Sign    Worried About Running Out of Food in the Last Year: Never true    Ran Out of Food in the Last Year: Never true  Transportation Needs: No Transportation Needs (05/07/2018)   PRAPARE - Hydrologist (Medical): No    Lack of Transportation (Non-Medical): No  Physical Activity: Sufficiently Active (05/07/2018)   Exercise Vital Sign    Days of Exercise per Week: 5 days    Minutes of Exercise per Session: 150+ min  Stress: Stress Concern Present (05/07/2018)   [place]    Feeling of Stress : Very much  Social Connections: Somewhat Isolated (05/07/2018)   Social  Connection and Isolation Panel [NHANES]    Frequency of Communication with Friends and Family: More than three times a week    Frequency of Social Gatherings with Friends and Family: Never    Attends Religious Services: 1 to 4 times per year    Active Member of [employer] or Organizations: No    Attends Archivist Meetings: Never    Marital Status: Never married  Intimate Partner Violence: Not At Risk (05/07/2018)   Humiliation, Afraid, Rape, and Kick questionnaire    Fear of Current or Ex-Partner: No    Emotionally Abused: No    Physically Abused: No    Sexually Abused: No   Review of Systems One bout of diarrhea--after first mucinex No N/V Eating fair--appetite overall down (not with this illness)    Objective:   Physical Exam Constitutional:      Appearance: Normal appearance.  HENT:     Right Ear: Tympanic membrane and ear canal normal.     Left Ear: Tympanic membrane and ear canal normal.     Nose:     Comments: Moderate nasal inflammation    Mouth/Throat:     Comments: No sig erythema Pulmonary:     Effort: Pulmonary effort is normal.     Breath sounds: Normal breath sounds. No wheezing or rales.  Musculoskeletal:     Cervical back: Neck supple.  Lymphadenopathy:     Cervical: No cervical adenopathy.  Neurological:     Mental Status: She is alert.            Assessment & Plan:

## 2021-12-19 ENCOUNTER — Telehealth: Payer: Self-pay | Admitting: Internal Medicine

## 2021-12-19 MED ORDER — DOXYCYCLINE HYCLATE 100 MG PO TABS: 100.0000 mg | ORAL_TABLET | Freq: Two times a day (BID) | ORAL | 0 refills | Status: DC

## 2021-12-19 NOTE — Telephone Encounter (Signed)
Spoke to pt. She said the pharmacy already has it ready and she is on the way to get it now.

## 2021-12-19 NOTE — Telephone Encounter (Signed)
Please let her know I sent an antibiotic for her to her pharmacy

## 2021-12-19 NOTE — Telephone Encounter (Signed)
Pt called asking what should she do for her congestion? Pt stated she seen [place] on 12/16/21. Pt thought it would get better but it hasn't. Pt stated she has developed a cough & its gotten worse over night. Pt states it feels as if it is fleam in throat & stuck in one place. Pt wanted advice on what to do? Call back # [phone]

## 2021-12-27 ENCOUNTER — Ambulatory Visit (INDEPENDENT_AMBULATORY_CARE_PROVIDER_SITE_OTHER)
Admission: RE | Admit: 2021-12-27 | Discharge: 2021-12-27 | Disposition: A | Discharge status: Home / Self Care | Payer: Medicare HMO | Source: Ambulatory Visit | Attending: Family Medicine | Admitting: Family Medicine

## 2021-12-27 ENCOUNTER — Ambulatory Visit (INDEPENDENT_AMBULATORY_CARE_PROVIDER_SITE_OTHER): Payer: Medicare HMO | Admitting: Family Medicine

## 2021-12-27 VITALS — BP 110/72 | HR 61 | Temp 97.3°F | Ht 68.0 in | Wt 133.5 lb

## 2021-12-27 DIAGNOSIS — R051 Acute cough: Secondary | ICD-10-CM

## 2021-12-27 DIAGNOSIS — R059 Cough, unspecified: Secondary | ICD-10-CM | POA: Diagnosis not present

## 2021-12-27 NOTE — Progress Notes (Signed)
Patient ID: Deborah Price, female    DOB: 07-28-1950, 71 y.o.   MRN: [id number]  This visit was conducted in person.  BP 110/72   Pulse 61   Temp (!) 97.3 F (36.3 C) (Temporal)   Ht 5\' 8"  (1.727 m)   Wt 133 lb 8 oz (60.6 kg)   SpO2 99%   BMI 20.30 kg/m    CC:  Chief Complaint  Patient presents with   Follow-up    Cough/congestion/wheezing Completed abx last night     Subjective:   HPI: Deborah Price is a 71 y.o. female  smoker presenting on 12/27/2021 for Follow-up (Cough/congestion/wheezing/Completed abx last night )   Reviewed OV from PCP on 10/2... Date of onset 12/14/21 Initial ST, chest congestion, wheeze. Negative COVID test x 2. Treated with  doxycycline x 7 days as was not improving after 1 week of symptoms.   Today she reports her symptoms started to imporoive after 3 days of antibiotics.. movement of mucu. 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  Sinuses are improving , but chest congestion  changed to be deep cough, prodiuctive cough.. light beige.  No fever. No blood in sputum.   No SOB, no wheeze. No chest tightness.      Relevant past medical, surgical, family and social history reviewed and updated as indicated. Interim medical history since our last visit reviewed. Allergies and medications reviewed and updated. Outpatient Medications Prior to Visit  Medication Sig Dispense Refill   benzonatate (TESSALON) 200 MG capsule Take 200 mg by mouth as needed.     Cholecalciferol (VITAMIN D3) 50 MCG (2000 UT) TABS Take 1 tablet by mouth daily.     Guaifenesin (MUCINEX MAXIMUM STRENGTH) 1200 MG TB12 Take 1,200 mg by mouth every 12 (twelve) hours as needed.     levothyroxine (SYNTHROID) 137 MCG tablet TAKE 1 TABLET (137 MCG TOTAL) BY MOUTH DAILY BEFORE BREAKFAST. (NOTE NEW DOSE) 90 tablet 1   MULTIPLE VITAMIN PO Take 1 tablet by mouth daily.     vitamin B-12 (CYANOCOBALAMIN) 500 MCG tablet Take 500 mcg by mouth daily.     doxycycline (VIBRA-TABS) 100 MG  tablet Take 1 tablet (100 mg total) by mouth 2 (two) times daily. 14 tablet 0   No facility-administered medications prior to visit.     Per HPI unless specifically indicated in ROS section below Review of Systems  Constitutional:  Negative for fatigue and fever.  HENT:  Negative for congestion.   Eyes:  Negative for pain.  Respiratory:  Negative for cough and shortness of breath.   Cardiovascular:  Negative for chest pain, palpitations and leg swelling.  Gastrointestinal:  Negative for abdominal pain.  Genitourinary:  Negative for dysuria and vaginal bleeding.  Musculoskeletal:  Negative for back pain.  Neurological:  Negative for syncope, light-headedness and headaches.  Psychiatric/Behavioral:  Negative for dysphoric mood.    Objective:  BP 110/72   Pulse 61   Temp (!) 97.3 F (36.3 C) (Temporal)   Ht 5\' 8"  (1.727 m)   Wt 133 lb 8 oz (60.6 kg)   SpO2 99%   BMI 20.30 kg/m   Wt Readings from Last 3 Encounters:  12/27/21 133 lb 8 oz (60.6 kg)  12/16/21 135 lb (61.2 kg)  10/04/21 138 lb (62.6 kg)      Physical Exam Constitutional:      General: She is not in acute distress.    Appearance: Normal appearance. She is well-developed. She is  not ill-appearing or toxic-appearing.  HENT:     Head: Normocephalic.     Right Ear: Hearing, tympanic membrane, ear canal and external ear normal. Tympanic membrane is not erythematous, retracted or bulging.     Left Ear: Hearing, tympanic membrane, ear canal and external ear normal. Tympanic membrane is not erythematous, retracted or bulging.     Nose: No mucosal edema or rhinorrhea.     Right Sinus: No maxillary sinus tenderness or frontal sinus tenderness.     Left Sinus: No maxillary sinus tenderness or frontal sinus tenderness.     Mouth/Throat:     Pharynx: Uvula midline.  Eyes:     General: Lids are normal. Lids are everted, no foreign bodies appreciated.     Conjunctiva/sclera: Conjunctivae normal.     Pupils: Pupils are  equal, round, and reactive to light.  Neck:     Thyroid: No thyroid mass or thyromegaly.     Vascular: No carotid bruit.     Trachea: Trachea normal.  Cardiovascular:     Rate and Rhythm: Normal rate and regular rhythm.     Pulses: Normal pulses.     Heart sounds: Normal heart sounds, S1 normal and S2 normal. No murmur heard.    No friction rub. No gallop.  Pulmonary:     Effort: Pulmonary effort is normal. No tachypnea or respiratory distress.     Breath sounds: Rhonchi present. No decreased breath sounds, wheezing or rales.     Comments: Occ intermittent scatter rhonchi and appears to have to pull in air strongly to have full breath.  Extended  exhalation Abdominal:     General: Bowel sounds are normal.     Palpations: Abdomen is soft.     Tenderness: There is no abdominal tenderness.  Musculoskeletal:     Cervical back: Normal range of motion and neck supple.  Skin:    General: Skin is warm and dry.     Findings: No rash.  Neurological:     Mental Status: She is alert.  Psychiatric:        Mood and Affect: Mood is not anxious or depressed.        Speech: Speech normal.        Behavior: Behavior normal. Behavior is cooperative.        Thought Content: Thought content normal.        Judgment: Judgment normal.       Results for orders placed or performed during the hospital encounter of 03/22/21  Surgical pathology  Result Value Ref Range   SURGICAL PATHOLOGY      SURGICAL PATHOLOGY CASE: [id number] PATIENT: [facility] Report     Specimen Submitted: A. Colon polyp, sigmoid; cold snare  Clinical History: Positive colorectal cancer screening using cologuard test R19.5; colon cancer screening Z12.11.  Diverticulosis; polyps      DIAGNOSIS: A. COLON POLYP, SIGMOID; COLD SNARE: - HYPERPLASTIC POLYP. - NEGATIVE FOR DYSPLASIA AND MALIGNANCY.   GROSS DESCRIPTION: A. Labeled: Cold snare sigmoid polyp Received: Formalin Collection time:  11:31 AM on 03/22/2021 Placed into formalin time: 11:31 AM on 03/22/2021 Tissue fragment(s): Multiple Size: Aggregate, 1.5 x 0.4 x 0.3 cm Description: Received is a single fragment of tan soft tissue admixed with intestinal debris.  The ratio of soft tissue to intestinal debris is 50: 50.  The soft tissue fragment has a resection margin which is inked green.  This fragment is bisected. Entirely submitted in 1 cassette.  RB 03/22/2021   Final  Diagnosis performed by A Maryjane Hurter, MD.   Electronically signed 03/25/2021 8:27:32AM The electronic signature indicates that the named Attending Pathologist has evaluated the specimen Technical component performed at [facility], [address], [place], [place] [address] Lab: [id number] Dir: Jolene Schimke, MD, MMM  Professional component performed at [facility], [address] [place], [place], [place] [address] Lab: [phone] Dir: Beryle Quant, MD      COVID 19 screen:  No recent travel or known exposure to COVID19 The patient denies respiratory symptoms of COVID 19 at this time. The importance of social distancing was discussed today.   Assessment and Plan    Problem List Items Addressed This Visit     Acute cough - Primary    Persistent cough now ongoing greater than 3 weeks.  Some initial improvement with doxycycline but cough now seems to be deeper.  Lungs clear to auscultation for the most part but occasional scattered rhonchi.  Will send for chest x-ray to evaluate for residual pneumonia.  If continued cough we can consider a course of prednisone for postinfectious bronchospasm.      Relevant Orders   DG Chest 2 View     Kerby Nora, MD

## 2021-12-27 NOTE — Assessment & Plan Note (Signed)
Persistent cough now ongoing greater than 3 weeks.  Some initial improvement with doxycycline but cough now seems to be deeper.  Lungs clear to auscultation for the most part but occasional scattered rhonchi.  Will send for chest x-ray to evaluate for residual pneumonia.  If continued cough we can consider a course of prednisone for postinfectious bronchospasm.

## 2022-02-03 ENCOUNTER — Encounter: Payer: Self-pay | Admitting: Internal Medicine

## 2022-02-03 ENCOUNTER — Telehealth: Payer: Self-pay

## 2022-02-03 ENCOUNTER — Ambulatory Visit (INDEPENDENT_AMBULATORY_CARE_PROVIDER_SITE_OTHER): Payer: Medicare HMO | Admitting: Internal Medicine

## 2022-02-03 VITALS — BP 116/70 | HR 77 | Temp 97.9°F | Ht 68.0 in | Wt 137.0 lb

## 2022-02-03 DIAGNOSIS — J4531 Mild persistent asthma with (acute) exacerbation: Secondary | ICD-10-CM | POA: Diagnosis not present

## 2022-02-03 MED ORDER — BENZONATATE 200 MG PO CAPS: 200.0000 mg | ORAL_CAPSULE | Freq: Three times a day (TID) | ORAL | 0 refills | Status: DC | PRN

## 2022-02-03 MED ORDER — DOXYCYCLINE HYCLATE 100 MG PO TABS: 100.0000 mg | ORAL_TABLET | Freq: Two times a day (BID) | ORAL | 0 refills | Status: DC

## 2022-02-03 MED ORDER — PREDNISONE 20 MG PO TABS: 40.0000 mg | ORAL_TABLET | Freq: Every day | ORAL | 0 refills | Status: DC

## 2022-02-03 NOTE — Telephone Encounter (Signed)
Per appt notes pt already has appt scheduled with Dr Alphonsus Sias 02/03/22 at 12 noon. Sending note to Dr Alphonsus Sias.   [facility] Night - Client Nonclinical Telephone Record  [facility] Client [facility] Night - Client Client Site [facility] - Night Provider Tillman Abide- MD Contact Type Call Who Is Calling Patient / Member / Family / Caregiver Caller Name Margurete Guaman Caller Phone Number [phone] Patient Name Deborah Price Patient DOB 30-Dec-1950 Call Type Message Only Information Provided Reason for Call Request to Schedule Office Appointment Initial Comment Caller states she saw her doctor for congestion in the beginning of October. She was given medication but it has not helped. She still has chest congestion and would like to schedule an appointment. Patient request to speak to RN No Additional Comment Office hours provided. Disp. Time Disposition Final User 02/03/2022 7:16:51 AM General Information Provided Yes Spigner, Samara Deist Call Closed By: Verne Grain Transaction Date/Time: 02/03/2022 7:13:12 AM (ET

## 2022-02-03 NOTE — Telephone Encounter (Signed)
I will check her at today's appointment

## 2022-02-03 NOTE — Progress Notes (Signed)
Subjective:    Patient ID: Deborah Price, female    DOB: Mar 11, 1951, 71 y.o.   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     levothyroxine (SYNTHROID) 137 MCG tablet TAKE 1 TABLET (137 MCG TOTAL) BY MOUTH DAILY BEFORE BREAKFAST. (NOTE NEW DOSE) 90 tablet 1   MULTIPLE VITAMIN PO Take 1 tablet by mouth daily.     vitamin B-12 (CYANOCOBALAMIN) 500 MCG tablet Take 500 mcg by mouth daily.     No current facility-administered medications on file prior to visit.    Allergies  Allergen Reactions   Bee Venom    Ciprofloxacin Other (See Comments)    Possible tendon problems   Codeine Nausea And Vomiting   Penicillins Hives    Past Medical History:  Diagnosis Date   Allergy    Mitral valve prolapse    Thyroid disease     Past Surgical History:  Procedure Laterality Date   ABDOMINAL HYSTERECTOMY  1996   COLONOSCOPY WITH PROPOFOL N/A 03/22/2021   Procedure: COLONOSCOPY WITH PROPOFOL;  Surgeon: Toney Reil, MD;  Location: [facility] ENDOSCOPY;  Service: Gastroenterology;  Laterality: N/A;    Family History  Problem Relation Age of Onset   Hypertension Mother    Dementia Mother    Bladder Cancer Neg Hx    Kidney cancer Neg Hx    Heart disease Neg Hx    Cancer Neg Hx     Social History   Socioeconomic History   Marital status: Single    Spouse  name: Not on file   Number of children: 0   Years of education: Not on file   Highest education level: Master's degree (e.g., MA, MS, MEng, MEd, MSW, MBA)  Occupational History   Occupation: Emergency planning/management officer: [employer]    Comment: Adjunct and private  Tobacco Use   Smoking status: Some Days    Packs/day: 0.50    Years: 30.00    Total pack years: 15.00    Types: Cigarettes    Start date: 02/05/1985    Passive exposure: Never   Smokeless tobacco: Never   Tobacco comments:    4 cigarettes a day  Vaping Use   Vaping Use: Never used  Substance and Sexual Activity   Alcohol use: Yes    Alcohol/week: 0.0 standard drinks of alcohol    Comment: Occasional-wine on weekends   Drug use: No   Sexual activity: Not Currently  Other Topics Concern   Not on file  Social History Narrative   Patient is a Dentist at [employer]      No living will   [employer] Verdine Grenfell is health care POA   Would accept resuscitation attempts   Not sure about tube feeds   Social Determinants of Health   Financial Resource Strain: Low Risk  (05/07/2018)  Overall Financial Resource Strain (CARDIA)    Difficulty of Paying Living Expenses: Not hard at all  Food Insecurity: No Food Insecurity (05/07/2018)   Hunger Vital Sign    Worried About Running Out of Food in the Last Year: Never true    Ran Out of Food in the Last Year: Never true  Transportation Needs: No Transportation Needs (05/07/2018)   PRAPARE - Administrator, Civil Service (Medical): No    Lack of Transportation (Non-Medical): No  Physical Activity: Sufficiently Active (05/07/2018)   Exercise Vital Sign    Days of Exercise per Week: 5 days    Minutes of Exercise per Session: 150+ min  Stress: Stress Concern Present (05/07/2018)   [employer] of Occupational Health - Occupational Stress Questionnaire    Feeling of Stress : Very much  Social Connections: Somewhat Isolated (05/07/2018)   Social  Connection and Isolation Panel [NHANES]    Frequency of Communication with Friends and Family: More than three times a week    Frequency of Social Gatherings with Friends and Family: Never    Attends Religious Services: 1 to 4 times per year    Active Member of [employer] or Organizations: No    Attends Banker Meetings: Never    Marital Status: Never married  Intimate Partner Violence: Not At Risk (05/07/2018)   Humiliation, Afraid, Rape, and Kick questionnaire    Fear of Current or Ex-Partner: No    Emotionally Abused: No    Physically Abused: No    Sexually Abused: No   Review of Systems Feels slightly "fuzzy headed" today Still blowing nose No fever Only nausea/vomiting was from severe cough    Objective:   Physical Exam Constitutional:      General: She is not in acute distress.    Appearance: Normal appearance.  HENT:     Head:     Comments: No sinus tenderness    Right Ear: Tympanic membrane and ear canal normal.     Left Ear: Tympanic membrane and ear canal normal.     Nose: Congestion present.     Mouth/Throat:     Pharynx: No oropharyngeal exudate or posterior oropharyngeal erythema.  Pulmonary:     Effort: Pulmonary effort is normal.     Breath sounds: Normal breath sounds. No wheezing or rales.  Musculoskeletal:     Cervical back: Neck supple.  Lymphadenopathy:     Cervical: No cervical adenopathy.  Neurological:     Mental Status: She is alert.            Assessment & Plan:

## 2022-02-03 NOTE — Assessment & Plan Note (Signed)
Will try the doxy again---for 10 days though Prednisone 40 x 3d, then 20 x 3 days Working on cigarette cessation No worrisome findings on exam

## 2022-02-12 ENCOUNTER — Other Ambulatory Visit: Payer: Self-pay | Admitting: Internal Medicine

## 2022-02-13 ENCOUNTER — Encounter: Payer: Self-pay | Admitting: Internal Medicine

## 2022-02-13 ENCOUNTER — Ambulatory Visit (INDEPENDENT_AMBULATORY_CARE_PROVIDER_SITE_OTHER): Payer: Medicare HMO | Admitting: Internal Medicine

## 2022-02-13 VITALS — BP 110/70 | HR 66 | Temp 97.6°F | Ht 67.25 in | Wt 137.0 lb

## 2022-02-13 DIAGNOSIS — F39 Unspecified mood [affective] disorder: Secondary | ICD-10-CM

## 2022-02-13 DIAGNOSIS — F17200 Nicotine dependence, unspecified, uncomplicated: Secondary | ICD-10-CM | POA: Diagnosis not present

## 2022-02-13 DIAGNOSIS — E039 Hypothyroidism, unspecified: Secondary | ICD-10-CM | POA: Diagnosis not present

## 2022-02-13 DIAGNOSIS — R69 Illness, unspecified: Secondary | ICD-10-CM | POA: Diagnosis not present

## 2022-02-13 DIAGNOSIS — Z0000 Encounter for general adult medical examination without abnormal findings: Secondary | ICD-10-CM | POA: Diagnosis not present

## 2022-02-13 DIAGNOSIS — E559 Vitamin D deficiency, unspecified: Secondary | ICD-10-CM

## 2022-02-13 DIAGNOSIS — J4531 Mild persistent asthma with (acute) exacerbation: Secondary | ICD-10-CM | POA: Diagnosis not present

## 2022-02-13 LAB — COMPREHENSIVE METABOLIC PANEL
ALT: 12 U/L (ref 0–35)
AST: 16 U/L (ref 0–37)
Albumin: 4.1 g/dL (ref 3.5–5.2)
Alkaline Phosphatase: 74 U/L (ref 39–117)
BUN: 13 mg/dL (ref 6–23)
CO2: 30 mEq/L (ref 19–32)
Calcium: 9.2 mg/dL (ref 8.4–10.5)
Chloride: 103 mEq/L (ref 96–112)
Creatinine, Ser: 0.81 mg/dL (ref 0.40–1.20)
GFR: 73.86 mL/min (ref >60.00)
Glucose, Bld: 82 mg/dL (ref 70–99)
Potassium: 4.5 mEq/L (ref 3.5–5.1)
Sodium: 138 mEq/L (ref 135–145)
Total Bilirubin: 0.8 mg/dL (ref 0.2–1.2)
Total Protein: 7 g/dL (ref 6.0–8.3)

## 2022-02-13 LAB — CBC
HCT: 46.8 % — ABNORMAL HIGH (ref 36.0–46.0)
Hemoglobin: 15.9 g/dL — ABNORMAL HIGH (ref 12.0–15.0)
MCHC: 34 g/dL (ref 30.0–36.0)
MCV: 98.9 fl (ref 78.0–100.0)
Platelets: 299 10*3/uL (ref 150.0–400.0)
RBC: 4.74 Mil/uL (ref 3.87–5.11)
RDW: 13.7 % (ref 11.5–15.5)
WBC: 8.5 10*3/uL (ref 4.0–10.5)

## 2022-02-13 LAB — VITAMIN D 25 HYDROXY (VIT D DEFICIENCY, FRACTURES): VITD: 38.23 ng/mL (ref 30.00–100.00)

## 2022-02-13 LAB — T4, FREE: Free T4: 1.31 ng/dL (ref 0.60–1.60)

## 2022-02-13 LAB — TSH: TSH: 1.82 u[IU]/mL (ref 0.35–5.50)

## 2022-02-13 LAB — VITAMIN B12: Vitamin B-12: 430 pg/mL (ref 211–911)

## 2022-02-13 NOTE — Assessment & Plan Note (Signed)
I have personally reviewed the Medicare Annual Wellness questionnaire and have noted 1. The patient's medical and social history 2. Their use of alcohol, tobacco or illicit drugs 3. Their current medications and supplements 4. The patient's functional ability including ADL's, fall risks, home safety risks and hearing or visual             impairment. 5. Diet and physical activities 6. Evidence for depression or mood disorders  The patients weight, height, BMI and visual acuity have been recorded in the chart I have made referrals, counseling and provided education to the patient based review of the above and I have provided the pt with a written personalized care plan for preventive services.  I have provided you with a copy of your personalized plan for preventive services. Please take the time to review along with your updated medication list.'  Colon normal in January---will reconsider in 10 years Urged her to set up mammogram Stays fit with ballet Had flu and updated COVID already

## 2022-02-13 NOTE — Assessment & Plan Note (Signed)
On supplements 

## 2022-02-13 NOTE — Assessment & Plan Note (Signed)
Will set up lung cancer screening

## 2022-02-13 NOTE — Assessment & Plan Note (Signed)
Seems to be euthyroid On levothyroxine 137 mcg

## 2022-02-13 NOTE — Assessment & Plan Note (Signed)
Seems better Urged her to stop smoking

## 2022-02-13 NOTE — Progress Notes (Signed)
Vision Screening   Right eye Left eye Both eyes  Without correction     With correction 20/20 20/15 20/13   Hearing Screening - Comments:: Passed whisper test

## 2022-02-13 NOTE — Assessment & Plan Note (Signed)
Still dealing with grieving for parents, stress with family, difficulty with trying to stop smoking, transition to stopping work She requests counseling referral

## 2022-02-13 NOTE — Progress Notes (Signed)
Subjective:    Patient ID: Deborah Price, female    DOB: [birth year], 71 y.o.   MRN: [id number]  HPI Here for Medicare wellness visit and follow up of chronic health conditions Reviewed advanced directives Reviewed other doctors--Dr Vanga--GI, LTR Dental No hospitalizations or surgery in the past year Vision is fading for reading--needs new glasses. Plans to go to [facility] Hearing generally okay (stopped up now) Still smoking Enjoys glass of [place] on weekends No falls No depression or anhedonia Independent with instrumental ADLs No sig memory issues  Chest congestion is better Some sinus and ear symptoms now though No fever No SOB Has 1 more day of the antibiotic  Has cut down on cigarettes due to illness Once exams are done---she hopes to work harder on stopping smoking Discussed nicotine patch/lozenges  Energy levels, etc seem okay Continues on the levothyroxine Otherwise on vitamin therapy  Current Outpatient Medications on File Prior to Visit  Medication Sig Dispense Refill   Cholecalciferol (VITAMIN D3) 50 MCG (2000 UT) TABS Take 1 tablet by mouth daily.     doxycycline (VIBRA-TABS) 100 MG tablet Take 1 tablet (100 mg total) by mouth 2 (two) times daily. 20 tablet 0   levothyroxine (SYNTHROID) 137 MCG tablet TAKE 1 TABLET (137 MCG TOTAL) BY MOUTH DAILY BEFORE BREAKFAST. (NOTE NEW DOSE) 90 tablet 0   MULTIPLE VITAMIN PO Take 1 tablet by mouth daily.     vitamin B-12 (CYANOCOBALAMIN) 500 MCG tablet Take 500 mcg by mouth daily.     No current facility-administered medications on file prior to visit.    Allergies  Allergen Reactions   Bee Venom    Ciprofloxacin Other (See Comments)    Possible tendon problems   Codeine Nausea And Vomiting   Penicillins Hives    Past Medical History:  Diagnosis Date   Allergy    Mitral valve prolapse    Thyroid disease     Past Surgical History:  Procedure Laterality Date   ABDOMINAL HYSTERECTOMY  1996   COLONOSCOPY  WITH PROPOFOL N/A 03/22/2021   Procedure: COLONOSCOPY WITH PROPOFOL;  Surgeon: Toney Reil, MD;  Location: [facility] ENDOSCOPY;  Service: Gastroenterology;  Laterality: N/A;    Family History  Problem Relation Age of Onset   Hypertension Mother    Dementia Mother    Bladder Cancer Neg Hx    Kidney cancer Neg Hx    Heart disease Neg Hx    Cancer Neg Hx     Social History   Socioeconomic History   Marital status: Single    Spouse name: Not on file   Number of children: 0   Years of education: Not on file   Highest education level: Master's degree (e.g., MA, MS, MEng, MEd, MSW, MBA)  Occupational History   Occupation: Emergency planning/management officer: [employer]    Comment: Adjunct and private  Tobacco Use   Smoking status: Some Days    Packs/day: 0.50    Years: 30.00    Total pack years: 15.00    Types: Cigarettes    Start date: 02/05/1985    Passive exposure: Never   Smokeless tobacco: Never   Tobacco comments:    4 cigarettes a day  Vaping Use   Vaping Use: Never used  Substance and Sexual Activity   Alcohol use: Yes    Alcohol/week: 0.0 standard drinks of alcohol    Comment: Occasional-wine on weekends   Drug use: No   Sexual activity: Not Currently  Other Topics Concern   Not on file  Social History Narrative   Patient is a Statistician at [employer]      No living will   [place] is health care POA   Would accept resuscitation attempts   Not sure about tube feeds   Social Determinants of Health   Financial Resource Strain: Low Risk  (05/07/2018)   Overall Financial Resource Strain (CARDIA)    Difficulty of Paying Living Expenses: Not hard at all  Food Insecurity: No Food Insecurity (05/07/2018)   Hunger Vital Sign    Worried About Running Out of Food in the Last Year: Never true    [place] in the Last Year: Never true  Transportation Needs: No Transportation Needs (05/07/2018)   PRAPARE - Radiographer, therapeutic (Medical): No    Lack of Transportation (Non-Medical): No  Physical Activity: Sufficiently Active (05/07/2018)   Exercise Vital Sign    Days of Exercise per Week: 5 days    Minutes of Exercise per Session: 150+ min  Stress: Stress Concern Present (05/07/2018)   [place]    Feeling of Stress : Very much  Social Connections: Somewhat Isolated (05/07/2018)   Social Connection and Isolation Panel [NHANES]    Frequency of Communication with Friends and Family: More than three times a week    Frequency of Social Gatherings with Friends and Family: Never    Attends Religious Services: 1 to 4 times per year    Active Member of [employer] or Organizations: No    Attends Archivist Meetings: Never    Marital Status: Never married  Intimate Partner Violence: Not At Risk (05/07/2018)   Humiliation, Afraid, Rape, and Kick questionnaire    Fear of Current or Ex-Partner: No    Emotionally Abused: No    Physically Abused: No    Sexually Abused: No   Review of Systems Appetite down with illness--may have lost a little weight Sleeps okay--other than with the illness Wears seat belt Teeth fine---keeps up with dentist No heartburn or dysphagia Bowels move okay---slight mucus at times. No blood Voids fine---no incontinence No sig back or joint pains No suspicious skin lesions--plans to get derm check up No chest pain No dizziness or syncope    Objective:   Physical Exam Constitutional:      Appearance: Normal appearance.  HENT:     Mouth/Throat:     Comments: No lesions Eyes:     Conjunctiva/sclera: Conjunctivae normal.     Pupils: Pupils are equal, round, and reactive to light.  Cardiovascular:     Rate and Rhythm: Normal rate and regular rhythm.     Pulses: Normal pulses.     Heart sounds: No murmur heard.    No gallop.  Pulmonary:     Effort: Pulmonary effort is normal.     Breath sounds: No  wheezing or rales.     Comments: Sightly decreased breath sounds but clear Abdominal:     Palpations: Abdomen is soft.     Tenderness: There is no abdominal tenderness.  Musculoskeletal:     Cervical back: Neck supple.     Right lower leg: No edema.     Left lower leg: No edema.  Lymphadenopathy:     Cervical: No cervical adenopathy.  Skin:    Findings: No lesion or rash.  Neurological:     General: No focal deficit present.  Mental Status: She is alert and oriented to person, place, and time.     Comments: Mini-cog   Psychiatric:        Mood and Affect: Mood normal.        Behavior: Behavior normal.            Assessment & Plan:

## 2022-02-19 ENCOUNTER — Telehealth: Payer: Self-pay | Admitting: Internal Medicine

## 2022-02-19 NOTE — Telephone Encounter (Signed)
Pt called & stated a Psychiatric referral was submitted by [place]. Pt stated she saw in the referral notes that "mood disorder HCC", pt is asking for a explanation on what that means? Pt mentioned she saw in the notes that some statuses was labeled as "normal", so why is she being referred to a Psychiatric? Call back # [phone]

## 2022-02-19 NOTE — Telephone Encounter (Signed)
Spoke to pt. Explained what Dr Alphonsus Sias said about the referral. She is going to see Doree Barthel.

## 2022-03-04 ENCOUNTER — Other Ambulatory Visit: Payer: Self-pay | Admitting: Internal Medicine

## 2022-03-04 DIAGNOSIS — Z1231 Encounter for screening mammogram for malignant neoplasm of breast: Secondary | ICD-10-CM

## 2022-04-07 ENCOUNTER — Ambulatory Visit
Admission: RE | Admit: 2022-04-07 | Discharge: 2022-04-07 | Disposition: A | Discharge status: Home / Self Care | Payer: Medicare HMO | Source: Ambulatory Visit | Attending: Internal Medicine | Admitting: Internal Medicine

## 2022-04-07 DIAGNOSIS — Z1231 Encounter for screening mammogram for malignant neoplasm of breast: Secondary | ICD-10-CM | POA: Diagnosis not present

## 2022-04-17 DIAGNOSIS — H5213 Myopia, bilateral: Secondary | ICD-10-CM | POA: Diagnosis not present

## 2022-04-17 DIAGNOSIS — Z0100 Encounter for examination of eyes and vision without abnormal findings: Secondary | ICD-10-CM | POA: Diagnosis not present

## 2022-05-02 ENCOUNTER — Encounter: Payer: Self-pay | Admitting: *Deleted

## 2022-05-13 ENCOUNTER — Other Ambulatory Visit: Payer: Self-pay | Admitting: Internal Medicine

## 2022-06-03 ENCOUNTER — Telehealth: Payer: Self-pay

## 2022-06-03 ENCOUNTER — Emergency Department
Admission: EM | Admit: 2022-06-03 | Discharge: 2022-06-03 | Disposition: A | Discharge status: Home / Self Care | Payer: Medicare HMO | Attending: Emergency Medicine | Admitting: Emergency Medicine

## 2022-06-03 ENCOUNTER — Other Ambulatory Visit: Payer: Self-pay | Admitting: Family Medicine

## 2022-06-03 ENCOUNTER — Encounter: Payer: Self-pay | Admitting: Family Medicine

## 2022-06-03 ENCOUNTER — Ambulatory Visit (INDEPENDENT_AMBULATORY_CARE_PROVIDER_SITE_OTHER): Payer: Medicare HMO | Admitting: Family Medicine

## 2022-06-03 VITALS — BP 110/66 | HR 122 | Temp 97.3°F | Ht 67.25 in | Wt 137.4 lb

## 2022-06-03 DIAGNOSIS — Z716 Tobacco abuse counseling: Secondary | ICD-10-CM | POA: Insufficient documentation

## 2022-06-03 DIAGNOSIS — R000 Tachycardia, unspecified: Secondary | ICD-10-CM | POA: Diagnosis not present

## 2022-06-03 DIAGNOSIS — E039 Hypothyroidism, unspecified: Secondary | ICD-10-CM | POA: Insufficient documentation

## 2022-06-03 DIAGNOSIS — F17200 Nicotine dependence, unspecified, uncomplicated: Secondary | ICD-10-CM

## 2022-06-03 DIAGNOSIS — I471 Supraventricular tachycardia, unspecified: Secondary | ICD-10-CM

## 2022-06-03 DIAGNOSIS — F17210 Nicotine dependence, cigarettes, uncomplicated: Secondary | ICD-10-CM | POA: Diagnosis not present

## 2022-06-03 DIAGNOSIS — R002 Palpitations: Secondary | ICD-10-CM | POA: Diagnosis not present

## 2022-06-03 DIAGNOSIS — R69 Illness, unspecified: Secondary | ICD-10-CM | POA: Diagnosis not present

## 2022-06-03 DIAGNOSIS — Z743 Need for continuous supervision: Secondary | ICD-10-CM | POA: Diagnosis not present

## 2022-06-03 LAB — CBC WITH DIFFERENTIAL/PLATELET
Abs Immature Granulocytes: 0.04 10*3/uL (ref 0.00–0.07)
Basophils Absolute: 0.1 10*3/uL (ref 0.0–0.1)
Basophils Relative: 1 %
Eosinophils Absolute: 0.1 10*3/uL (ref 0.0–0.5)
Eosinophils Relative: 2 %
HCT: 46 % (ref 36.0–46.0)
Hemoglobin: 15.7 g/dL — ABNORMAL HIGH (ref 12.0–15.0)
Immature Granulocytes: 0 %
Lymphocytes Relative: 38 %
Lymphs Abs: 3.4 10*3/uL (ref 0.7–4.0)
MCH: 33 pg (ref 26.0–34.0)
MCHC: 34.1 g/dL (ref 30.0–36.0)
MCV: 96.6 fL (ref 80.0–100.0)
Monocytes Absolute: 0.6 10*3/uL (ref 0.1–1.0)
Monocytes Relative: 7 %
Neutro Abs: 4.7 10*3/uL (ref 1.7–7.7)
Neutrophils Relative %: 52 %
Platelets: 260 10*3/uL (ref 150–400)
RBC: 4.76 MIL/uL (ref 3.87–5.11)
RDW: 12.7 % (ref 11.5–15.5)
WBC: 9 10*3/uL (ref 4.0–10.5)
nRBC: 0 % (ref 0.0–0.2)

## 2022-06-03 LAB — COMPREHENSIVE METABOLIC PANEL
ALT: 11 U/L (ref 0–44)
AST: 18 U/L (ref 15–41)
Albumin: 3.4 g/dL — ABNORMAL LOW (ref 3.5–5.0)
Alkaline Phosphatase: 63 U/L (ref 38–126)
Anion gap: 8 (ref 5–15)
BUN: 15 mg/dL (ref 8–23)
CO2: 24 mmol/L (ref 22–32)
Calcium: 8.6 mg/dL — ABNORMAL LOW (ref 8.9–10.3)
Chloride: 108 mmol/L (ref 98–111)
Creatinine, Ser: 0.75 mg/dL (ref 0.44–1.00)
GFR, Estimated: >60 mL/min (ref >60)
Glucose, Bld: 92 mg/dL (ref 70–99)
Potassium: 3.7 mmol/L (ref 3.5–5.1)
Sodium: 140 mmol/L (ref 135–145)
Total Bilirubin: 0.8 mg/dL (ref 0.3–1.2)
Total Protein: 6.6 g/dL (ref 6.5–8.1)

## 2022-06-03 LAB — TSH: TSH: 0.172 u[IU]/mL — ABNORMAL LOW (ref 0.350–4.500)

## 2022-06-03 LAB — TROPONIN I (HIGH SENSITIVITY): Troponin I (High Sensitivity): 27 ng/L — ABNORMAL HIGH (ref <18)

## 2022-06-03 LAB — T4, FREE: Free T4: 1.8 ng/dL — ABNORMAL HIGH (ref 0.61–1.12)

## 2022-06-03 LAB — MAGNESIUM: Magnesium: 1.8 mg/dL (ref 1.7–2.4)

## 2022-06-03 MED ORDER — METOPROLOL SUCCINATE ER 25 MG PO TB24: 25.0000 mg | ORAL_TABLET | Freq: Every day | ORAL | 11 refills | Status: DC

## 2022-06-03 MED ORDER — NICOTINE POLACRILEX 4 MG MT LOZG: 4.0000 mg | LOZENGE | OROMUCOSAL | 0 refills | Status: DC | PRN

## 2022-06-03 MED ORDER — SODIUM CHLORIDE 0.9 % IV BOLUS
1000.0000 mL | Freq: Once | INTRAVENOUS | Status: AC
  Administered 2022-06-03: 1000 mL via INTRAVENOUS

## 2022-06-03 MED ORDER — METOPROLOL SUCCINATE ER 25 MG PO TB24
25.0000 mg | ORAL_TABLET | Freq: Every day | ORAL | Status: AC
  Administered 2022-06-03: 25 mg via ORAL
  Filled 2022-06-03: qty 1

## 2022-06-03 MED ORDER — NICOTINE 14 MG/24HR TD PT24: 14.0000 mg | MEDICATED_PATCH | TRANSDERMAL | 3 refills | Status: DC

## 2022-06-03 NOTE — Progress Notes (Addendum)
Subjective:    Patient ID: Deborah Price, female    DOB: [birth year], 72 y.o.   MRN: [id number]  HPI 72 yo pt of Dr Silvio Pate presents with c/o palpitations   Current smoker- 4 cig per day- increased recently due to some stress Perhaps 1 ppd  Was told she had mvp as a child -never had issues besides occ palpitation  Was a Engineer, mining    She teaches ballet at [employer] from Last 3 Encounters:  06/03/22 137 lb 6 oz (62.3 kg)  02/13/22 137 lb (62.1 kg)  02/03/22 137 lb (62.1 kg)   21.36 kg/m  Vitals:   06/03/22 1218  BP: 108/68  Pulse: 83  Temp: (!) 97.3 F (36.3 C)  SpO2: 97%   Last night 3 am  Woke her up heart was pounding  Thinks it was fast  Felt a bit anxious - perhaps harder to breathe  Felt off balance/ light headed  Tried to take her pulse - it seemed fast but could not tell a number    Took bp with a wrist cuff  Unsure what numbers were   Lasted about an hour  Then was able to get back to sleep   Got up at 7:30 , then 30 minutes later it started to go fast again until the 10-11 am     Tried valsalva  Tried cold water on face   Caffeine intake : some in the mornings  Had starbucks recently  She makes coffee at home  as well  Last night - has a cup of coffee which is unusual for her  Drank water this am  Held coffee this am    H/o hypothyroidism Lab Results  Component Value Date   TSH 1.82 02/13/2022   Takes levothy 137 mcg daily   More sinus issues recently with congestion  No sudafed   First EKG today NSR with rate of 70 Read as inc RBBB with r axis (this was also noted in 2019 for comparison)   2nd EKG SVT rate of 209  Pt is sob and feels chest pressure Unable to stop with carotid sinus massage or valsalva   Patient Active Problem List   Diagnosis Date Noted   Palpitations 06/03/2022   SVT (supraventricular tachycardia) 06/03/2022   Mild persistent asthmatic bronchitis with exacerbation 12/16/2021   Dry skin  10/04/2021   Rash 09/20/2021   Acute non-recurrent frontal sinusitis 05/23/2021   Positive colorectal cancer screening using Cologuard test    Polyp of sigmoid colon    Mood disorder ([place]) 02/01/2019   Shoulder impingement, right 02/01/2019   Advance directive discussed with patient 02/01/2019   Tobacco dependence 05/21/2018   Preventative health care 11/03/2017   Kidney stone on left side 08/05/2016   Vitamin D deficiency 11/06/2015   Hypothyroidism 10/09/2014   Past Medical History:  Diagnosis Date   Allergy    Mitral valve prolapse    Thyroid disease    Past Surgical History:  Procedure Laterality Date   ABDOMINAL HYSTERECTOMY  1996   COLONOSCOPY WITH PROPOFOL N/A 03/22/2021   Procedure: COLONOSCOPY WITH PROPOFOL;  Surgeon: Lin Landsman, MD;  Location: [facility] ENDOSCOPY;  Service: Gastroenterology;  Laterality: N/A;   Social History   Tobacco Use   Smoking status: Every Day    Packs/day: 0.50    Years: 30.00    Additional pack years: 0.00    Total pack years: 15.00    Types: Cigarettes  Start date: 02/05/1985    Passive exposure: Never   Smokeless tobacco: Never   Tobacco comments:    4 cigarettes a day  Vaping Use   Vaping Use: Never used  Substance Use Topics   Alcohol use: Yes    Alcohol/week: 0.0 standard drinks of alcohol    Comment: Occasional-wine on weekends   Drug use: No   Family History  Problem Relation Age of Onset   Hypertension Mother    Dementia Mother    Bladder Cancer Neg Hx    Kidney cancer Neg Hx    Heart disease Neg Hx    Cancer Neg Hx    Allergies  Allergen Reactions   Bee Venom    Ciprofloxacin Other (See Comments)    Possible tendon problems   Codeine Nausea And Vomiting   Penicillins Hives   Current Outpatient Medications on File Prior to Visit  Medication Sig Dispense Refill   Cholecalciferol (VITAMIN D3) 50 MCG (2000 UT) TABS Take 1 tablet by mouth daily.     levothyroxine (SYNTHROID) 137 MCG tablet TAKE 1 TABLET BY  MOUTH DAILY BEFORE BREAKFAST. (NOTE NEW DOSE) 90 tablet 3   MULTIPLE VITAMIN PO Take 1 tablet by mouth daily.     vitamin B-12 (CYANOCOBALAMIN) 500 MCG tablet Take 500 mcg by mouth daily.     No current facility-administered medications on file prior to visit.     Review of Systems  Constitutional:  Negative for activity change, appetite change, fatigue, fever and unexpected weight change.  HENT:  Negative for congestion, ear pain, rhinorrhea, sinus pressure and sore throat.   Eyes:  Negative for pain, redness and visual disturbance.  Respiratory:  Negative for cough, shortness of breath and wheezing.   Cardiovascular:  Negative for chest pain, palpitations and leg swelling.       Chest pressure    Gastrointestinal:  Negative for abdominal pain, blood in stool, constipation and diarrhea.  Endocrine: Negative for polydipsia and polyuria.  Genitourinary:  Negative for dysuria, frequency and urgency.  Musculoskeletal:  Negative for arthralgias, back pain and myalgias.  Skin:  Negative for pallor and rash.  Allergic/Immunologic: Negative for environmental allergies.  Neurological:  Negative for dizziness, syncope and headaches.  Hematological:  Negative for adenopathy. Does not bruise/bleed easily.  Psychiatric/Behavioral:  Negative for decreased concentration and dysphoric mood. The patient is not nervous/anxious.        Objective:   Physical Exam Constitutional:      General: She is not in acute distress.    Appearance: Normal appearance. She is normal weight. She is not ill-appearing or diaphoretic.  HENT:     Head: Normocephalic and atraumatic.     Mouth/Throat:     Mouth: Mucous membranes are moist.  Eyes:     General: No scleral icterus.       Right eye: No discharge.        Left eye: No discharge.     Conjunctiva/sclera: Conjunctivae normal.     Pupils: Pupils are equal, round, and reactive to light.  Neck:     Vascular: No carotid bruit.  Cardiovascular:     Rate and  Rhythm: Tachycardia present. Rhythm irregular.     Heart sounds: No murmur heard. Musculoskeletal:     Cervical back: Neck supple. No tenderness.  Lymphadenopathy:     Cervical: No cervical adenopathy.  Neurological:     Mental Status: She is alert.           Assessment & Plan:  Problem List Items Addressed This Visit       Cardiovascular and Mediastinum   SVT (supraventricular tachycardia)    Pt has had intermittent episodes of palpitations/ rapid rate with sob  Here in office first EKG noted inc RBBB (close to similar in past) No syncope  Then during mid visit developed palpitations and sob and chest pressure Another EKG done SVT rate of 209  Unable to convert with carotid sinus massage and valsalva   EMS came  [facility] back in NSR with rate in 70s and improvement in symptoms  Stayed light headed with low/nl bp  Ultimately decided to transport to hospital for further eva/ troponins/labs High likely hood of going back into SVT Will need cardiology referral once eval further             Other   Palpitations   Tobacco dependence   Other Visit Diagnoses     Palpitation    -  Primary   Relevant Orders   EKG 12-Lead (Completed)   EKG 12-Lead (Completed)

## 2022-06-03 NOTE — Patient Instructions (Addendum)
You will need a cardiology referral once you are evaluated in the ER Please update [place] once you are back

## 2022-06-03 NOTE — Telephone Encounter (Signed)
Seen in the office

## 2022-06-03 NOTE — Discharge Instructions (Signed)
Take metoprolol XL once daily and follow-up in cardiology clinic.  Thank you for choosing [place] for your health care today!  Please see your primary doctor this week for a follow up appointment.   Sometimes, in the early stages of certain disease courses it is difficult to detect in the emergency department evaluation -- so, it is important that you continue to monitor your symptoms and call your doctor right away or return to the emergency department if you develop any new or worsening symptoms.  Please go to the following website to schedule new (and existing) patient appointments:   [email or web address]  If you do not have a primary doctor try calling the following clinics to establish care:  If you have insurance:  [facility] [phone] [place] [address]   [facility]  [phone] [address] [place] [address]   If you do not have insurance:  [facility]  [phone] [address] [place] [address]   The following is another list of primary care offices in the area who are accepting new patients at this time.  Please reach out to one of them directly and let them know you would like to schedule an appointment to follow up on an Emergency Department visit, and/or to establish a new primary care provider (PCP).  There are likely other primary care clinics in the are who are accepting new patients, but this is an excellent place to start:  [place] physician: Dr Lavon Paganini [address] [place], [place] [address] [phone]  [facility] Lead Physician: Dr Steele Sizer [address], [place], [place] [address] [phone]  [place] Physician: Dr Park Liter [address] [place], [place] [address] [phone]  [facility] Lead Physician: Dr Dewaine Oats [place], [place], [place] [address] [phone]  [place] [facility] [place] Physician: Dr Halina Maidens [address] Colin Broach [place], [place] [address] [phone]   It was my pleasure to care for you today.   Hoover Brunette Jacelyn Grip, MD

## 2022-06-03 NOTE — Telephone Encounter (Signed)
Pt called to office and pt missed call from access nurse. I spoke with pt; pt said as a child was told had mitral valve prolapse and pt on and off would have short periods (few mins) of palpitations (fast heart beat but pt could not describe as hard or strong heart beat and pt does not think any irregularity of heart beat noted. Pt said last night woke up at 3 AM with fast heart beat that lasted approx 1 hr. Pt went back to sleep and at 8 AM this morning another episode of fast heart beat occurred and pt could not remember having 2 episodes that close together. Pt said 8 AM episode lasted 1 - 1 1/2 hrs. Pt said now pt does not think any fast heart beat and pt can walk around without palpitations. Pt said No CP and No SOB now but with each episode this morning pt did feel SOB while having fast heartbeat. Pt said no pounding or irregular heart beat now or earlier. Pt said she does drink caffeine in the  mornings but this morning pt did not have coffee and has only been drinking water. Pt also noted has had sneezing and head congestion for about 1 wk; no fever. Pt said she had a pulse ox and it broke and pt has "something she puts on wrist but is not sure if working properly and she really does not know how to read it"the patient said she is in no distress at this time and wants to keep appt with Dr Glori Bickers today at 12:30. UC & ED precautions given and pt voiced understanding. Pt has never seen cardiologist. Sending note to Dr Glori Bickers and [employer].    [place] [facility] - Client TELEPHONE ADVICE RECORD AccessNurse Patient Name: Deborah Price [facility] Gender: Female DOB: 1950-10-18 Age: 89 Y 10 M 15 D Return Phone Number: [phone] (Primary) Address: City/ State/ Zip: [place]  [address] Client [name] - Client Client Site [place] Provider [place], Roque Lias - MD Contact Type Call Who Is Calling Patient / Member / Family /  Caregiver Call Type Triage / Clinical Relationship To Patient Self Return Phone Number [phone] (Primary) Chief Complaint Heart palpitations or irregular heartbeat Reason for Call Symptomatic / Request for [place] states she is having heart palpitations for about an hour. Translation No Disp. Time Eilene Ghazi Time) Disposition Final User 06/03/2022 10:07:31 AM Attempt made - message left Dew, RN, Levada Dy 06/03/2022 10:25:08 AM Attempt made - no message left Dew, RN, Levada Dy 06/03/2022 10:37:50 AM FINAL ATTEMPT MADE - message left Yes Lucky Cowboy, RN, Levada Dy Final Disposition 06/03/2022 10:37:50 AM FINAL ATTEMPT MADE - message left Yes Dew, RN, Glenard Haring

## 2022-06-03 NOTE — Assessment & Plan Note (Addendum)
Pt has had intermittent episodes of palpitations/ rapid rate with sob  Here in office first EKG noted inc RBBB (close to similar in past) No syncope  Then during mid visit developed palpitations and sob and chest pressure Another EKG done SVT rate of 209  Unable to convert with carotid sinus massage and valsalva   EMS came  [facility] back in NSR with rate in 70s and improvement in symptoms  Stayed light headed with low/nl bp  Ultimately decided to transport to hospital for further eva/ troponins/labs High likely hood of going back into SVT Will need cardiology referral once eval further

## 2022-06-03 NOTE — ED Triage Notes (Signed)
Pt brought to rm 13 w c/o palpitations, initial 3am and then at [facility] office this morning. EMS reports SVT that converted with vagal maneuvers. NSR on arrival. Pt A&oX4, nad NOTED

## 2022-06-03 NOTE — Telephone Encounter (Signed)
Please check in with her today to see how she is feeling  It looks like the ER started her on low dose metoprolol   She has appt with cardiology upcoming   Lab Results  Component Value Date   TSH 0.172 (L) 06/03/2022   Needs her thyroid dose lowered  I pended levothyroxine 125 mcg to sent to pharmacy of choice   Re check TSH in 4-6 wk

## 2022-06-03 NOTE — Assessment & Plan Note (Signed)
Pt has smoked more with stress lately  Is motivated to quit

## 2022-06-03 NOTE — ED Provider Notes (Signed)
[facility] Provider Note    Event Date/Time   First MD Initiated Contact with Patient 06/03/22 1437     (approximate)   History   Palpitations   HPI  Deborah Price is a 72 y.o. female   Past medical history of hypothyroid, remote history of mitral valve prolapse as a child, who presents with SVT.  She has had palpitations lasting approximately 10 seconds throughout a majority of her life, she has never had formally evaluated but over the last 2 days she has had prolonged episodes of the sensation of heart racing most recently this morning for approximately 2 hours straight.  When she went to her primary doctor clinic they were able to capture what appeared to be SVT which broke into normal sinus rhythm with right bundle branch block.  When she has had these episodes in the past she is able to perform a Valsalva maneuver maneuver which she was incidentally self discovered terminates her palpitations.  She is otherwise felt well no other recent illnesses, denies chest pain, shortness of breath, lightheadedness.  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  She drank an extra coffee the night before.  She has had no recent changes in her levothyroxine which she is compliant with.    External Medical Documents Reviewed: Family medicine office visit from earlier today with a note and EKGs showing intermittent episodes of palpitations, right bundle branch block and SVT.      Physical Exam   Triage Vital Signs: ED Triage Vitals  Enc Vitals Group     BP 06/03/22 1408 (!) 124/92     Pulse Rate 06/03/22 1408 69     Resp 06/03/22 1408 12     Temp 06/03/22 1408 98.4 F (36.9 C)     Temp Source 06/03/22 1408 Oral     SpO2 06/03/22 1408 100 %     Weight 06/03/22 1410 155 lb (70.3 kg)     Height 06/03/22 1410 5\' 7"  (1.702 m)     Head Circumference --      Peak Flow --       Pain Score --      Pain Loc --      Pain Edu? --      Excl. in [place]? --     Most recent vital signs: Vitals:   06/03/22 1408 06/03/22 1600  BP: (!) 124/92 (!) 129/93  Pulse: 69 85  Resp: 12 (!) 25  Temp: 98.4 F (36.9 C) 98.4 F (36.9 C)  SpO2: 100% 98%    General: Awake, no distress.  CV:  Good peripheral perfusion.  Resp:  Normal effort.  Abd:  No distention.  Other:  Awake alert comfortable appearing with normal hemodynamics and a normal heart rate at 69 normal sinus rhythm with right bundle branch block.  Skin appears warm and well-perfused, no tremors, heart sounds normal no murmurs lungs are clear abdomen soft nontender   ED Results / Procedures / Treatments   Labs (all labs ordered are listed, but only abnormal results are displayed) Labs Reviewed  COMPREHENSIVE METABOLIC PANEL - Abnormal; Notable for the following components:      Result Value   Calcium 8.6 (*)    Albumin 3.4 (*)    All other components within normal limits  CBC WITH DIFFERENTIAL/PLATELET - Abnormal; Notable for the following components:   Hemoglobin 15.7 (*)  All other components within normal limits  TSH - Abnormal; Notable for the following components:   TSH 0.172 (*)    All other components within normal limits  T4, FREE - Abnormal; Notable for the following components:   Free T4 1.80 (*)    All other components within normal limits  TROPONIN I (HIGH SENSITIVITY) - Abnormal; Notable for the following components:   Troponin I (High Sensitivity) 27 (*)    All other components within normal limits  MAGNESIUM     I ordered and reviewed the above labs they are notable for hemoglobin 15.7 which is at baseline.    EKG  ED ECG REPORT I, Lucillie Garfinkel, the attending physician, personally viewed and interpreted this ECG.   Date: 06/03/2022  EKG Time: 1415  Rate: 71  Rhythm: nsr  Axis: nl  Intervals:rbbb  ST&T Change: No acute ischemic changes  PROCEDURES:  Critical Care performed:  No  Procedures   MEDICATIONS ORDERED IN ED: Medications  sodium chloride 0.9 % bolus 1,000 mL (0 mLs Intravenous Stopped 06/03/22 1529)  metoprolol succinate (TOPROL-XL) 24 hr tablet 25 mg (25 mg Oral Given 06/03/22 1611)    External physician / consultants:  I spoke with Dr END cardiology regarding care plan for this patient.   IMPRESSION / MDM / ASSESSMENT AND PLAN / ED COURSE  I reviewed the triage vital signs and the nursing notes.                                Patient's presentation is most consistent with acute presentation with potential threat to life or bodily function.  Differential diagnosis includes, but is not limited to, supraventricular tachycardia, atrial fibrillation, electrolyte derangement, thyroid dysfunction, ACS   The patient is on the cardiac monitor to evaluate for evidence of arrhythmia and/or significant heart rate changes.  MDM: This is a patient with SVT that spontaneously resolved and is normotensive and otherwise asymptomatic, denies chest pain--  exacerbating factors may be caffeine intake, stress, electrolyte derangement, thyroid dysfunction.  T4 slightly elevated asked her to half her dose of thyroxine and follow-up with her PMD for recheck.  She has been stable in the emergency department with no further tachy with VS and normotension.  She has had no chest pain.  Her initial troponin is slightly elevated I think this is in the setting of prolonged SVT rather than acute ischemia.  I talked with Dr. Saunders Revel of cardiology who recommends metoprolol 25 daily and will follow-up in clinic.  She is a smoker, has been increasing her smoking in the setting of stress at work.  I spent 5 minutes counseling this patient on smoking cessation.  We spoke about the patient's current tobacco use, impact of smoking, assessed willingness to quit, methods for cessation including medical management and nicotine replacement therapy (which I prescribed to the patient) and  advised follow-up with primary doctor to continue to address smoking cessation.   I considered hospitalization for admission or observation however given stability in the emergency department and plan for outpatient follow-up with both her PMD for thyroid retesting and reduction of her dose of thyroid medicines as well as metoprolol prescription and cardiology follow-up, I think discharge and follow-up at this point as an outpatient is most appropriate -she understands to return the emergency department with any new or worsening symptoms.        FINAL CLINICAL IMPRESSION(S) / ED DIAGNOSES   Final  diagnoses:  SVT (supraventricular tachycardia)  Encounter for smoking cessation counseling     Rx / DC Orders   ED Discharge Orders          Ordered    metoprolol succinate (TOPROL XL) 25 MG 24 hr tablet  Daily        06/03/22 1540    Ambulatory referral to Cardiology        06/03/22 1547    nicotine (NICODERM CQ - DOSED IN MG/24 HOURS) 14 mg/24hr patch  Every 24 hours        06/03/22 1640    nicotine polacrilex (NICOTINE MINI) 4 MG lozenge  As needed        06/03/22 1640             Note:  This document was prepared using Dragon voice recognition software and may include unintentional dictation errors.    Lucillie Garfinkel, MD 06/03/22 [phone]

## 2022-06-04 ENCOUNTER — Telehealth: Payer: Self-pay

## 2022-06-04 NOTE — Telephone Encounter (Signed)
Patient returned call back. I have her scheduled for tomorrow to see Dr Silvio Pate. And advised her to stop taking anything with biotin in it.

## 2022-06-04 NOTE — Telephone Encounter (Signed)
Thank you :)

## 2022-06-04 NOTE — Telephone Encounter (Signed)
I left VM for pt but pt has a f/u with PCP tomorrow. So I will not send in any meds since PCP is discussing this directly with her tomorrow. FYI to PCP and Dr. Glori Bickers

## 2022-06-04 NOTE — Telephone Encounter (Signed)
Left message on VM advising pt that Dr Silvio Pate wants to see her in the office to discuss recent lab work. Also, advised her that if she is taking anything with Biotin in it, she needs to stop taking it.

## 2022-06-05 ENCOUNTER — Encounter: Payer: Self-pay | Admitting: Internal Medicine

## 2022-06-05 ENCOUNTER — Other Ambulatory Visit: Payer: Self-pay | Admitting: Nurse Practitioner

## 2022-06-05 ENCOUNTER — Ambulatory Visit (INDEPENDENT_AMBULATORY_CARE_PROVIDER_SITE_OTHER): Payer: Medicare HMO | Admitting: Internal Medicine

## 2022-06-05 VITALS — BP 110/78 | HR 86 | Temp 97.5°F | Ht 67.0 in | Wt 137.0 lb

## 2022-06-05 DIAGNOSIS — E039 Hypothyroidism, unspecified: Secondary | ICD-10-CM

## 2022-06-05 DIAGNOSIS — R69 Illness, unspecified: Secondary | ICD-10-CM | POA: Diagnosis not present

## 2022-06-05 DIAGNOSIS — F17200 Nicotine dependence, unspecified, uncomplicated: Secondary | ICD-10-CM | POA: Diagnosis not present

## 2022-06-05 DIAGNOSIS — J0110 Acute frontal sinusitis, unspecified: Secondary | ICD-10-CM

## 2022-06-05 DIAGNOSIS — Z1231 Encounter for screening mammogram for malignant neoplasm of breast: Secondary | ICD-10-CM

## 2022-06-05 DIAGNOSIS — I471 Supraventricular tachycardia, unspecified: Secondary | ICD-10-CM

## 2022-06-05 MED ORDER — DOXYCYCLINE HYCLATE 100 MG PO TABS: 100.0000 mg | ORAL_TABLET | Freq: Two times a day (BID) | ORAL | 0 refills | Status: DC

## 2022-06-05 MED ORDER — LEVOTHYROXINE SODIUM 112 MCG PO TABS: 112.0000 ug | ORAL_TABLET | Freq: Every day | ORAL | 3 refills | Status: DC

## 2022-06-05 NOTE — Assessment & Plan Note (Signed)
Sick for a while and now more productive cough Will Rx doxy 100 bid x 7 days

## 2022-06-05 NOTE — Progress Notes (Signed)
Subjective:    Patient ID: Deborah Price, female    DOB: Jun 09, 1950, 72 y.o.   MRN: [id number]  HPI Here for ER follow up after SVT flare  Seen here but didn't feel right after apparent conversion back to sinus ER visit was "a fiasco" Didn't have call button---had to void and had to disconnect IV, etc  Hadn't gone back into the SVT fortunately Eventually left--recommended starting the metoprolol but she didn't   Noted brief spells back even as a teenager Learned to hold breath/Valsalva--and she would go back to regular rhythm Hasn't had anything for 5-6 years  Has been on levothyroxine 137 for some years  Current Outpatient Medications on File Prior to Visit  Medication Sig Dispense Refill   Cholecalciferol (VITAMIN D3) 50 MCG (2000 UT) TABS Take 1 tablet by mouth daily.     levothyroxine (SYNTHROID) 137 MCG tablet TAKE 1 TABLET BY MOUTH DAILY BEFORE BREAKFAST. (NOTE NEW DOSE) 90 tablet 3   MULTIPLE VITAMIN PO Take 1 tablet by mouth daily.     metoprolol succinate (TOPROL XL) 25 MG 24 hr tablet Take 1 tablet (25 mg total) by mouth daily. (Patient not taking: Reported on 06/05/2022) 30 tablet 11   nicotine (NICODERM CQ - DOSED IN MG/24 HOURS) 14 mg/24hr patch Place 1 patch (14 mg total) onto the skin daily. (Patient not taking: Reported on 06/05/2022) 90 patch 3   nicotine polacrilex (NICOTINE MINI) 4 MG lozenge Take 1 lozenge (4 mg total) by mouth as needed for smoking cessation. (Patient not taking: Reported on 06/05/2022) 100 tablet 0   No current facility-administered medications on file prior to visit.    Allergies  Allergen Reactions   Bee Venom    Ciprofloxacin Other (See Comments)    Possible tendon problems   Codeine Nausea And Vomiting   Penicillins Hives    Past Medical History:  Diagnosis Date   Allergy    Mitral valve prolapse    Thyroid disease     Past Surgical History:  Procedure Laterality Date   ABDOMINAL HYSTERECTOMY  1996   COLONOSCOPY WITH  PROPOFOL N/A 03/22/2021   Procedure: COLONOSCOPY WITH PROPOFOL;  Surgeon: Lin Landsman, MD;  Location: [facility] ENDOSCOPY;  Service: Gastroenterology;  Laterality: N/A;    Family History  Problem Relation Age of Onset   Hypertension Mother    Dementia Mother    Bladder Cancer Neg Hx    Kidney cancer Neg Hx    Heart disease Neg Hx    Cancer Neg Hx     Social History   Socioeconomic History   Marital status: Single    Spouse name: Not on file   Number of children: 0   Years of education: Not on file   Highest education level: Master's degree (e.g., MA, MS, MEng, MEd, MSW, MBA)  Occupational History   Occupation: Teacher, adult education: [employer]    Comment: Adjunct and private  Tobacco Use   Smoking status: Every Day    Packs/day: 0.50    Years: 30.00    Additional pack years: 0.00    Total pack years: 15.00    Types: Cigarettes    Start date: 02/05/1985    Passive exposure: Never   Smokeless tobacco: Never   Tobacco comments:    4 cigarettes a day  Vaping Use   Vaping Use: Never used  Substance and Sexual Activity   Alcohol use: Yes    Alcohol/week: 0.0 standard drinks of alcohol  Comment: Occasional-wine on weekends   Drug use: No   Sexual activity: Not Currently  Other Topics Concern   Not on file  Social History Narrative   Patient is a Statistician at [employer]      No living will   [place] is health care POA   Would accept resuscitation attempts   Not sure about tube feeds   Social Determinants of Health   Financial Resource Strain: Low Risk  (06/05/2022)   Overall Financial Resource Strain (CARDIA)    Difficulty of Paying Living Expenses: Not hard at all  Food Insecurity: No Food Insecurity (06/05/2022)   Hunger Vital Sign    Worried About Running Out of Food in the Last Year: Never true    Ran Out of Food in the Last Year: Never true  Transportation Needs: No Transportation Needs (06/05/2022)   PRAPARE -  Hydrologist (Medical): No    Lack of Transportation (Non-Medical): No  Physical Activity: Sufficiently Active (06/05/2022)   Exercise Vital Sign    Days of Exercise per Week: 6 days    Minutes of Exercise per Session: 100 min  Stress: No Stress Concern Present (06/05/2022)   [place]    Feeling of Stress : Only a little  Social Connections: Unknown (06/05/2022)   Social Connection and Isolation Panel [NHANES]    Frequency of Communication with Friends and Family: Twice a week    Frequency of Social Gatherings with Friends and Family: More than three times a week    Attends Religious Services: Patient declined    Marine scientist or Organizations: No    Attends Music therapist: Not on file    Marital Status: Never married  Intimate Partner Violence: Not At Risk (05/07/2018)   Humiliation, Afraid, Rape, and Kick questionnaire    Fear of Current or Ex-Partner: No    Emotionally Abused: No    Physically Abused: No    Sexually Abused: No   Review of Systems Sleeping okay Has been busy---teaching and doing choreography Eating fairly well--weight stable    Objective:   Physical Exam Constitutional:      Appearance: Normal appearance.  Cardiovascular:     Rate and Rhythm: Normal rate and regular rhythm.  Musculoskeletal:     Right lower leg: No edema.     Left lower leg: No edema.  Neurological:     Mental Status: She is alert.  Psychiatric:        Mood and Affect: Mood normal.        Behavior: Behavior normal.            Assessment & Plan:

## 2022-06-05 NOTE — Assessment & Plan Note (Signed)
She goes long periods without smoking Will try stopping with just with the lozenges prn for now

## 2022-06-05 NOTE — Assessment & Plan Note (Signed)
Has gone back decades ?worse now with stress and overtreatment with thyroid? Will cut thyroid Asked her to start the toprol 25 Will see cardiologist to discuss whether to proceed with ablation

## 2022-06-05 NOTE — Assessment & Plan Note (Signed)
Would prefer undertreatment to overtreatment Will cut the dose to 112 and recheck in a few months

## 2022-06-10 ENCOUNTER — Telehealth: Payer: Self-pay

## 2022-06-10 NOTE — Telephone Encounter (Signed)
        Patient  visited [place] on 3/19     Telephone encounter attempt :  1st  A HIPAA compliant voice message was left requesting a return call.  Instructed patient to call back.   [facility] [phone] [address], [place], [place] [address] Phone: [phone] Email: [email or web address]

## 2022-06-11 ENCOUNTER — Telehealth: Payer: Self-pay

## 2022-06-11 NOTE — Telephone Encounter (Signed)
        Patient  visited [facility] on 3/19     Telephone encounter attempt : 2nd  A HIPAA compliant voice message was left requesting a return call.  Instructed patient to call back     [facility] [phone] [address], [place], [place] [address] Phone: [phone] Email: [email or web address]

## 2022-06-12 ENCOUNTER — Ambulatory Visit: Payer: Medicare HMO

## 2022-06-12 ENCOUNTER — Encounter: Payer: Self-pay | Admitting: Cardiology

## 2022-06-12 ENCOUNTER — Ambulatory Visit: Payer: Medicare HMO | Attending: Cardiology | Admitting: Cardiology

## 2022-06-12 VITALS — BP 102/74 | HR 66 | Ht 68.0 in | Wt 136.0 lb

## 2022-06-12 DIAGNOSIS — I471 Supraventricular tachycardia, unspecified: Secondary | ICD-10-CM

## 2022-06-12 DIAGNOSIS — F17200 Nicotine dependence, unspecified, uncomplicated: Secondary | ICD-10-CM | POA: Diagnosis not present

## 2022-06-12 DIAGNOSIS — R69 Illness, unspecified: Secondary | ICD-10-CM | POA: Diagnosis not present

## 2022-06-12 MED ORDER — METOPROLOL SUCCINATE ER 25 MG PO TB24: 12.5000 mg | ORAL_TABLET | Freq: Every day | ORAL | 0 refills | Status: DC

## 2022-06-12 NOTE — Patient Instructions (Signed)
Medication Instructions:   START Toprol XL - Take 0.5 tablet ( 12.5mg ) by mouth daily.   *If you need a refill on your cardiac medications before your next appointment, please call your pharmacy*   Lab Work:  None Ordered  If you have labs (blood work) drawn today and your tests are completely normal, you will receive your results only by: [place] (if you have MyChart) OR A paper copy in the mail If you have any lab test that is abnormal or we need to change your treatment, we will call you to review the results.   Testing/Procedures:  Your physician has requested that you have an echocardiogram. Echocardiography is a painless test that uses sound waves to create images of your heart. It provides your doctor with information about the size and shape of your heart and how well your heart's chambers and valves are working. This procedure takes approximately one hour. There are no restrictions for this procedure. Please do NOT wear cologne, perfume, aftershave, or lotions (deodorant is allowed). Please arrive 15 minutes prior to your appointment time.   2. Your physician has recommended that you wear a Zio monitor.   This monitor is a medical device that records the heart's electrical activity. Doctors most often use these monitors to diagnose arrhythmias. Arrhythmias are problems with the speed or rhythm of the heartbeat. The monitor is a small device applied to your chest. You can wear one while you do your normal daily activities. While wearing this monitor if you have any symptoms to push the button and record what you felt. Once you have worn this monitor for the period of time provider prescribed (Usually 14 days), you will return the monitor device in the postage paid box. Once it is returned they will download the data collected and provide [place] with a report which the provider will then review and we will call you with those results. Important tips:  Avoid showering during the  first 24 hours of wearing the monitor. Avoid excessive sweating to help maximize wear time. Do not submerge the device, no hot tubs, and no swimming pools. Keep any lotions or oils away from the patch. After 24 hours you may shower with the patch on. Take brief showers with your back facing the shower head.  Do not remove patch once it has been placed because that will interrupt data and decrease adhesive wear time. Push the button when you have any symptoms and write down what you were feeling. Once you have completed wearing your monitor, remove and place into box which has postage paid and place in your outgoing mailbox.  If for some reason you have misplaced your box then call our office and we can provide another box and/or mail it off for you.     Follow-Up: At [facility], you and your health needs are our priority.  As part of our continuing mission to provide you with exceptional heart care, we have created designated Provider Care Teams.  These Care Teams include your primary Cardiologist (physician) and Advanced Practice Providers (APPs -  Physician Assistants and Nurse Practitioners) who all work together to provide you with the care you need, when you need it.  We recommend signing up for the patient portal called "MyChart".  Sign up information is provided on this After Visit Summary.  MyChart is used to connect with patients for Virtual Visits (Telemedicine).  Patients are able to view lab/test results, encounter notes, upcoming appointments, etc.  Non-urgent messages can be sent to your provider as well.   To learn more about what you can do with MyChart, go to [email or web address].    Your next appointment:    After testing  Provider:   You may see Kate Sable, MD or one of the following Advanced Practice Providers on your designated Care Team:   Murray Hodgkins, NP Christell Faith, PA-C Cadence Kathlen Mody, PA-C Gerrie Nordmann, NP

## 2022-06-12 NOTE — Progress Notes (Signed)
Cardiology Office Note:    Date:  06/12/2022   ID:  Deborah Price, DOB 11-13-1950, MRN [id number]  PCP:  Venia Carbon, MD   [place] [facility] Cardiologist:  Kate Sable, MD     Referring MD: Lucillie Garfinkel, MD   Chief Complaint  Patient presents with   New Patient (Initial Visit)    Mitral valve prolapse as child.  ED visit on 06/03/22 with SVT    History of Present Illness:    Deborah Price is a 72 y.o. female with a hx of hypothyroidism, mitral valve prolapse, SVT, current smoker x 30+ years who presents to establish care.  Patient woke up last week at about 3 AM with symptoms of palpitations lasting 1 hour.  Symptoms resolved spontaneously, but returned about 5 hours later lasting about an hour and a half.  She presented to her PCP where EKG was obtained showing SVT heart rate 209.  Symptoms spontaneously resolved, while at PCPs office.  She was advised to go to the emergency room.  In the ED, EKG showed sinus rhythm, thyroid function testing indicated elevated T4.  Hypothyroid medications were adjusted.  Was prescribed Toprol-XL 25 mg daily in the ED, patient has not started due to worrisome side effects of dizziness, fatigue.  She teaches ballet at [employer], did not want to lose heart coordination is starting metoprolol.  She states having occasional palpitations over the years, nothing as persistent as last week.  Denies any symptoms since.   Past Medical History:  Diagnosis Date   Allergy    Mitral valve prolapse    Thyroid disease     Past Surgical History:  Procedure Laterality Date   ABDOMINAL HYSTERECTOMY  1996   COLONOSCOPY WITH PROPOFOL N/A 03/22/2021   Procedure: COLONOSCOPY WITH PROPOFOL;  Surgeon: Lin Landsman, MD;  Location: [facility] ENDOSCOPY;  Service: Gastroenterology;  Laterality: N/A;    Current Medications: Current Meds  Medication Sig   Cholecalciferol (VITAMIN D3) 50 MCG (2000 UT) TABS Take 1 tablet by mouth daily.    doxycycline (VIBRA-TABS) 100 MG tablet Take 1 tablet (100 mg total) by mouth 2 (two) times daily.   levothyroxine (SYNTHROID) 112 MCG tablet Take 1 tablet (112 mcg total) by mouth daily.   MULTIPLE VITAMIN PO Take 1 tablet by mouth daily.     Allergies:   Bee venom, Ciprofloxacin, Codeine, and Penicillins   Social History   Socioeconomic History   Marital status: Single    Spouse name: Not on file   Number of children: 0   Years of education: Not on file   Highest education level: Master's degree (e.g., MA, MS, MEng, MEd, MSW, MBA)  Occupational History   Occupation: Teacher, adult education: [employer]    Comment: Adjunct and private  Tobacco Use   Smoking status: Every Day    Packs/day: 0.50    Years: 30.00    Additional pack years: 0.00    Total pack years: 15.00    Types: Cigarettes    Start date: 02/05/1985    Passive exposure: Never   Smokeless tobacco: Never   Tobacco comments:    4 cigarettes a day  Vaping Use   Vaping Use: Never used  Substance and Sexual Activity   Alcohol use: Yes    Comment: Occasional   Drug use: No   Sexual activity: Not Currently  Other Topics Concern   Not on file  Social History Narrative   Patient is  a Statistician at [employer]      No living will   Reserve is health care POA   Would accept resuscitation attempts   Not sure about tube feeds   Social Determinants of Health   Financial Resource Strain: Low Risk  (06/05/2022)   Overall Financial Resource Strain (CARDIA)    Difficulty of Paying Living Expenses: Not hard at all  Food Insecurity: No Food Insecurity (06/05/2022)   Hunger Vital Sign    Worried About Running Out of Food in the Last Year: Never true    Ran Out of Food in the Last Year: Never true  Transportation Needs: No Transportation Needs (06/05/2022)   PRAPARE - Hydrologist (Medical): No    Lack of Transportation (Non-Medical): No  Physical Activity:  Sufficiently Active (06/05/2022)   Exercise Vital Sign    Days of Exercise per Week: 6 days    Minutes of Exercise per Session: 100 min  Stress: No Stress Concern Present (06/05/2022)   [place]    Feeling of Stress : Only a little  Social Connections: Unknown (06/05/2022)   Social Connection and Isolation Panel [NHANES]    Frequency of Communication with Friends and Family: Twice a week    Frequency of Social Gatherings with Friends and Family: More than three times a week    Attends Religious Services: Patient declined    Marine scientist or Organizations: No    Attends Music therapist: Not on file    Marital Status: Never married     Family History: The patient's family history includes Dementia in her mother; Hypertension in her mother. There is no history of Bladder Cancer, Kidney cancer, Heart disease, or Cancer.  ROS:   Please see the history of present illness.     All other systems reviewed and are negative.  EKGs/Labs/Other Studies Reviewed:    The following studies were reviewed today:   EKG:  EKG is  ordered today.  The ekg ordered today demonstrates normal sinus rhythm, incomplete right bundle branch block  Recent Labs: 06/03/2022: ALT 11; BUN 15; Creatinine, Ser 0.75; Hemoglobin 15.7; Magnesium 1.8; Platelets 260; Potassium 3.7; Sodium 140; TSH 0.172  Recent Lipid Panel    Component Value Date/Time   CHOL 193 01/28/2018 1143   CHOL 195 02/06/2015 1107   TRIG 91 01/28/2018 1143   HDL 56 01/28/2018 1143   HDL 56 02/06/2015 1107   CHOLHDL 3.4 01/28/2018 1143   VLDL 21 11/06/2015 1109   LDLCALC 118 (H) 01/28/2018 1143     Risk Assessment/Calculations:               Physical Exam:    VS:  BP 102/74 (BP Location: Right Arm, Patient Position: Sitting)   Pulse 66   Ht 5\' 8"  (1.727 m)   Wt 136 lb (61.7 kg)   SpO2 98%   BMI 20.68 kg/m     Wt Readings from Last 3  Encounters:  06/12/22 136 lb (61.7 kg)  06/05/22 137 lb (62.1 kg)  06/03/22 155 lb (70.3 kg)     GEN:  Well nourished, well developed in no acute distress HEENT: Normal NECK: No JVD; No carotid bruits CARDIAC: RRR, no murmurs, rubs, gallops RESPIRATORY:  Clear to auscultation without rales, wheezing or rhonchi  ABDOMEN: Soft, non-tender, non-distended MUSCULOSKELETAL:  No edema; No deformity  SKIN: Warm and dry NEUROLOGIC:  Alert and oriented  x 3 PSYCHIATRIC:  Normal affect   ASSESSMENT:    1. SVT (supraventricular tachycardia)   2. Smoking    PLAN:    In order of problems listed above:  SVT, currently in sinus rhythm.  Get echocardiogram, start Toprol-XL 12.5 mg daily, place cardiac monitor.  Refer to EP.  Thyroid medication suggested after elevated T4 levels. Current smoker, smoking cessation advised.  Follow-up in 6 to 8 weeks      Medication Adjustments/Labs and Tests Ordered: Current medicines are reviewed at length with the patient today.  Concerns regarding medicines are outlined above.  Orders Placed This Encounter  Procedures   Ambulatory referral to Cardiac Electrophysiology   LONG TERM MONITOR (3-14 DAYS)   EKG 12-Lead   ECHOCARDIOGRAM COMPLETE   Meds ordered this encounter  Medications   metoprolol succinate (TOPROL XL) 25 MG 24 hr tablet    Sig: Take 0.5 tablets (12.5 mg total) by mouth daily.    Dispense:  45 tablet    Refill:  0    Patient Instructions  Medication Instructions:   START Toprol XL - Take 0.5 tablet ( 12.5mg ) by mouth daily.   *If you need a refill on your cardiac medications before your next appointment, please call your pharmacy*   Lab Work:  None Ordered  If you have labs (blood work) drawn today and your tests are completely normal, you will receive your results only by: [place] (if you have MyChart) OR A paper copy in the mail If you have any lab test that is abnormal or we need to change your treatment, we  will call you to review the results.   Testing/Procedures:  Your physician has requested that you have an echocardiogram. Echocardiography is a painless test that uses sound waves to create images of your heart. It provides your doctor with information about the size and shape of your heart and how well your heart's chambers and valves are working. This procedure takes approximately one hour. There are no restrictions for this procedure. Please do NOT wear cologne, perfume, aftershave, or lotions (deodorant is allowed). Please arrive 15 minutes prior to your appointment time.   2. Your physician has recommended that you wear a Zio monitor.   This monitor is a medical device that records the heart's electrical activity. Doctors most often use these monitors to diagnose arrhythmias. Arrhythmias are problems with the speed or rhythm of the heartbeat. The monitor is a small device applied to your chest. You can wear one while you do your normal daily activities. While wearing this monitor if you have any symptoms to push the button and record what you felt. Once you have worn this monitor for the period of time provider prescribed (Usually 14 days), you will return the monitor device in the postage paid box. Once it is returned they will download the data collected and provide [place] with a report which the provider will then review and we will call you with those results. Important tips:  Avoid showering during the first 24 hours of wearing the monitor. Avoid excessive sweating to help maximize wear time. Do not submerge the device, no hot tubs, and no swimming pools. Keep any lotions or oils away from the patch. After 24 hours you may shower with the patch on. Take brief showers with your back facing the shower head.  Do not remove patch once it has been placed because that will interrupt data and decrease adhesive wear time. Push the button when you  have any symptoms and write down what you were  feeling. Once you have completed wearing your monitor, remove and place into box which has postage paid and place in your outgoing mailbox.  If for some reason you have misplaced your box then call our office and we can provide another box and/or mail it off for you.     Follow-Up: At [facility], you and your health needs are our priority.  As part of our continuing mission to provide you with exceptional heart care, we have created designated Provider Care Teams.  These Care Teams include your primary Cardiologist (physician) and Advanced Practice Providers (APPs -  Physician Assistants and Nurse Practitioners) who all work together to provide you with the care you need, when you need it.  We recommend signing up for the patient portal called "MyChart".  Sign up information is provided on this After Visit Summary.  MyChart is used to connect with patients for Virtual Visits (Telemedicine).  Patients are able to view lab/test results, encounter notes, upcoming appointments, etc.  Non-urgent messages can be sent to your provider as well.   To learn more about what you can do with MyChart, go to [email or web address].    Your next appointment:    After testing  Provider:   You may see Kate Sable, MD or one of the following Advanced Practice Providers on your designated Care Team:   Murray Hodgkins, NP Christell Faith, PA-C Cadence Kathlen Mody, PA-C Gerrie Nordmann, NP     Signed, Kate Sable, MD  06/12/2022 12:40 PM    [place]

## 2022-06-17 ENCOUNTER — Telehealth: Payer: Self-pay | Admitting: Internal Medicine

## 2022-06-17 NOTE — Telephone Encounter (Unsigned)
Pt received a call for an EP appointment. Pt thought you had said she could wait on that until the other tests have been returned. Currently wearing a Zio monitor and will complete this test 4/15. Wants to double check about going forward with EP appointment since there aren't results from the other two tests. Echo scheduled May 9.   Please advise.

## 2022-07-09 DIAGNOSIS — I471 Supraventricular tachycardia, unspecified: Secondary | ICD-10-CM | POA: Diagnosis not present

## 2022-07-09 DIAGNOSIS — I4719 Other supraventricular tachycardia: Secondary | ICD-10-CM | POA: Diagnosis not present

## 2022-07-16 ENCOUNTER — Ambulatory Visit: Payer: Medicare HMO | Attending: Cardiology | Admitting: Cardiology

## 2022-07-16 ENCOUNTER — Encounter: Payer: Self-pay | Admitting: Cardiology

## 2022-07-16 VITALS — BP 92/60 | HR 65 | Ht 68.0 in | Wt 136.0 lb

## 2022-07-16 DIAGNOSIS — I471 Supraventricular tachycardia, unspecified: Secondary | ICD-10-CM

## 2022-07-16 NOTE — Patient Instructions (Signed)
Medication Instructions:  Your physician recommends that you continue on your current medications as directed. Please refer to the Current Medication list given to you today.  *If you need a refill on your cardiac medications before your next appointment, please call your pharmacy*  Follow-Up: At [facility], you and your health needs are our priority.  As part of our continuing mission to provide you with exceptional heart care, we have created designated Provider Care Teams.  These Care Teams include your primary Cardiologist (physician) and Advanced Practice Providers (APPs -  Physician Assistants and Nurse Practitioners) who all work together to provide you with the care you need, when you need it.  Your next appointment:   4 month(s)  Provider:   Steffanie Dunn, MD

## 2022-07-16 NOTE — Progress Notes (Signed)
  Electrophysiology Office Note:    Date:  07/16/2022   ID:  Deborah Price, DOB 1950-08-24, MRN [id number]  [facility] Cardiologist:  Debbe Odea, MD  [facility] Electrophysiologist:  Lanier Prude, MD   Referring MD: Debbe Odea, MD   Chief Complaint: SVT  History of Present Illness:    Deborah Price is a 72 y.o. female who I am seeing today for an evaluation of SVT at the request of Dr. Azucena Cecil.  The patient reports many years of SVT symptoms.  She reports being able to terminate the arrhythmia by taking a deep breath or bending forward and holding her breath.  She had an episode of SVT recently that was longer.  Episodes lasted greater than 1 hour and were clustered over 1 to 2 days.  She ultimately sought care with her primary care physician who was able to get an EKG during SVT.  No syncope or presyncope.  She is a current smoker.  Has a reported history of mitral valve prolapse.  Has hypothyroidism and is on Synthroid.  Interestingly, around the time of her recent SVT cluster her thyroid labs demonstrated excess thyroid function.        Their past medical, social and family history was reveiwed.   ROS:   Please see the history of present illness.    All other systems reviewed and are negative.  EKGs/Labs/Other Studies Reviewed:    The following studies were reviewed today:    Physical Exam:    VS:  BP 92/60   Pulse 65   Ht 5\' 8"  (1.727 m)   Wt 136 lb (61.7 kg)   SpO2 98%   BMI 20.68 kg/m     Wt Readings from Last 3 Encounters:  07/16/22 136 lb (61.7 kg)  06/12/22 136 lb (61.7 kg)  06/05/22 137 lb (62.1 kg)     GEN:  Well nourished, well developed in no acute distress CARDIAC: RRR, no murmurs, rubs, gallops RESPIRATORY:  Clear to auscultation without rales, wheezing or rhonchi       ASSESSMENT AND PLAN:    1. SVT (supraventricular tachycardia)     #SVT Discussed her SVT during today's clinic appointment in detail.  We  discussed the pathophysiology of SVT and the different types.  We discussed treatment options including continued conservative management, antiarrhythmic drugs or EP study with possible catheter ablation.  I do wonder whether or not her recent cluster of SVT episodes was secondary to excessive thyroid function.  We will plan to touch base in clinic in about 3 months after her thyroid has had a chance to normalize.  If she continues to have episodes can reconsider possible catheter ablation.  Catheter ablation could also be considered if she wishes to control her SVT while not taking medications.           Total time spent with patient today 45 minutes. This includes reviewing records, evaluating the patient and coordinating care.     Signed, Rossie Muskrat. Lalla Brothers, MD, [facility], [facility] 07/16/2022 10:22 PM    Electrophysiology [facility]

## 2022-07-24 ENCOUNTER — Ambulatory Visit: Payer: Medicare HMO | Attending: Cardiology

## 2022-07-24 DIAGNOSIS — I471 Supraventricular tachycardia, unspecified: Secondary | ICD-10-CM | POA: Diagnosis not present

## 2022-07-24 LAB — ECHOCARDIOGRAM COMPLETE
AR max vel: 2.71 cm2
AV Area VTI: 2.76 cm2
AV Area mean vel: 2.58 cm2
AV Mean grad: 3 mmHg
AV Peak grad: 4.9 mmHg
Ao pk vel: 1.11 m/s
Area-P 1/2: 3.72 cm2
S' Lateral: 2.7 cm
Single Plane A4C EF: 58.5 %

## 2022-07-31 ENCOUNTER — Ambulatory Visit: Payer: Medicare HMO | Attending: Cardiology | Admitting: Cardiology

## 2022-07-31 ENCOUNTER — Encounter: Payer: Self-pay | Admitting: Cardiology

## 2022-07-31 VITALS — BP 80/66 | HR 65 | Ht 68.0 in | Wt 136.0 lb

## 2022-07-31 DIAGNOSIS — I471 Supraventricular tachycardia, unspecified: Secondary | ICD-10-CM

## 2022-07-31 DIAGNOSIS — F17200 Nicotine dependence, unspecified, uncomplicated: Secondary | ICD-10-CM

## 2022-07-31 NOTE — Patient Instructions (Addendum)
Medication Instructions:   STOP Metoprolol Succinate   *If you need a refill on your cardiac medications before your next appointment, please call your pharmacy*   Lab Work:  None Ordered  If you have labs (blood work) drawn today and your tests are completely normal, you will receive your results only by: MyChart Message (if you have MyChart) OR A paper copy in the mail If you have any lab test that is abnormal or we need to change your treatment, we will call you to review the results.   Testing/Procedures:  None Ordered    Follow-Up: At [facility], you and your health needs are our priority.  As part of our continuing mission to provide you with exceptional heart care, we have created designated Provider Care Teams.  These Care Teams include your primary Cardiologist (physician) and Advanced Practice Providers (APPs -  Physician Assistants and Nurse Practitioners) who all work together to provide you with the care you need, when you need it.  We recommend signing up for the patient portal called "MyChart".  Sign up information is provided on this After Visit Summary.  MyChart is used to connect with patients for Virtual Visits (Telemedicine).  Patients are able to view lab/test results, encounter notes, upcoming appointments, etc.  Non-urgent messages can be sent to your provider as well.   To learn more about what you can do with MyChart, go to [email or web address].    Your next appointment:   6 month(s)  Provider:   You may see Debbe Odea, MD or one of the following Advanced Practice Providers on your designated Care Team:   Nicolasa Ducking, NP Eula Listen, PA-C Cadence Fransico Michael, PA-C Charlsie Quest, NP

## 2022-07-31 NOTE — Progress Notes (Signed)
Cardiology Office Note:    Date:  07/31/2022   ID:  Deborah Price, DOB 1951/01/29, MRN [id number]  PCP:  Karie Schwalbe, MD   [facility] Providers Cardiologist:  Debbe Odea, MD Electrophysiologist:  Lanier Prude, MD     Referring MD: Karie Schwalbe, MD   Chief Complaint  Patient presents with   Follow-up    Discuss echo and Zio results.  Patient concerns with increasing fatigue.  BP office check 80/66, HR 65 today.    History of Present Illness:    Deborah Price is a 72 y.o. female with a hx of SVT, current smoker x 30+ years, hypothyroidism who presents for follow-up.  Previously seen to establish care due to history of palpitations and SVT.  Thyroid function testing was abnormal, Synthroid was adjusted.  Cardiac monitor was placed to evaluate any significant arrhythmias, Toprol-XL 12.5 mg daily was started.  Denies any significant palpitations since taking metoprolol, however she complains of fatigue, hair loss, nosebleeding since starting Toprol-XL.  Blood pressures have been running low with systolics in the 80s.    She teaches ballet at [employer], worried about dizziness, fatigue from medications.  Cardiac monitor 05/2022 1 episode of SVT lasting 9 beats.  No significant sustained arrhythmias. Echocardiogram 07/2022 EF 60 to 65%.   Past Medical History:  Diagnosis Date   Allergy    Mitral valve prolapse    Thyroid disease     Past Surgical History:  Procedure Laterality Date   ABDOMINAL HYSTERECTOMY  1996   COLONOSCOPY WITH PROPOFOL N/A 03/22/2021   Procedure: COLONOSCOPY WITH PROPOFOL;  Surgeon: Toney Reil, MD;  Location: [facility] ENDOSCOPY;  Service: Gastroenterology;  Laterality: N/A;    Current Medications: Current Meds  Medication Sig   Cholecalciferol (VITAMIN D3) 50 MCG (2000 UT) TABS Take 1 tablet by mouth daily.   levothyroxine (SYNTHROID) 112 MCG tablet Take 1 tablet (112 mcg total) by mouth daily.   MULTIPLE VITAMIN PO Take 1  tablet by mouth daily.   [DISCONTINUED] metoprolol succinate (TOPROL XL) 25 MG 24 hr tablet Take 0.5 tablets (12.5 mg total) by mouth daily.     Allergies:   Bee venom, Ciprofloxacin, Codeine, and Penicillins   Social History   Socioeconomic History   Marital status: Single    Spouse name: Not on file   Number of children: 0   Years of education: Not on file   Highest education level: Master's degree (e.g., MA, MS, MEng, MEd, MSW, MBA)  Occupational History   Occupation: Emergency planning/management officer: [employer]    Comment: Adjunct and private  Tobacco Use   Smoking status: Every Day    Packs/day: 0.50    Years: 30.00    Additional pack years: 0.00    Total pack years: 15.00    Types: Cigarettes    Start date: 02/05/1985    Passive exposure: Never   Smokeless tobacco: Never   Tobacco comments:    4 cigarettes a day  Vaping Use   Vaping Use: Never used  Substance and Sexual Activity   Alcohol use: Yes    Comment: Occasional   Drug use: No   Sexual activity: Not Currently  Other Topics Concern   Not on file  Social History Narrative   Patient is a Dentist at [employer]      No living will   [employer] Quaniyah Ladd is health care POA   Would accept resuscitation attempts  Not sure about tube feeds   Social Determinants of Health   Financial Resource Strain: Low Risk  (06/05/2022)   Overall Financial Resource Strain (CARDIA)    Difficulty of Paying Living Expenses: Not hard at all  Food Insecurity: No Food Insecurity (06/05/2022)   Hunger Vital Sign    Worried About Running Out of Food in the Last Year: Never true    Ran Out of Food in the Last Year: Never true  Transportation Needs: No Transportation Needs (06/05/2022)   PRAPARE - Administrator, Civil Service (Medical): No    Lack of Transportation (Non-Medical): No  Physical Activity: Sufficiently Active (06/05/2022)   Exercise Vital Sign    Days of Exercise per Week: 6 days    Minutes  of Exercise per Session: 100 min  Stress: No Stress Concern Present (06/05/2022)   [employer] of Occupational Health - Occupational Stress Questionnaire    Feeling of Stress : Only a little  Social Connections: Unknown (06/05/2022)   Social Connection and Isolation Panel [NHANES]    Frequency of Communication with Friends and Family: Twice a week    Frequency of Social Gatherings with Friends and Family: More than three times a week    Attends Religious Services: Patient declined    Database administrator or Organizations: No    Attends Engineer, structural: Not on file    Marital Status: Never married     Family History: The patient's family history includes Dementia in her mother; Hypertension in her mother. There is no history of Bladder Cancer, Kidney cancer, Heart disease, or Cancer.  ROS:   Please see the history of present illness.     All other systems reviewed and are negative.  EKGs/Labs/Other Studies Reviewed:    The following studies were reviewed today:   EKG:  EKG is  ordered today.  The ekg ordered today demonstrates normal sinus rhythm, incomplete right bundle branch block  Recent Labs: 06/03/2022: ALT 11; BUN 15; Creatinine, Ser 0.75; Hemoglobin 15.7; Magnesium 1.8; Platelets 260; Potassium 3.7; Sodium 140; TSH 0.172  Recent Lipid Panel    Component Value Date/Time   CHOL 193 01/28/2018 1143   CHOL 195 02/06/2015 1107   TRIG 91 01/28/2018 1143   HDL 56 01/28/2018 1143   HDL 56 02/06/2015 1107   CHOLHDL 3.4 01/28/2018 1143   VLDL 21 11/06/2015 1109   LDLCALC 118 (H) 01/28/2018 1143     Risk Assessment/Calculations:             Physical Exam:    VS:  BP (!) 80/66 (BP Location: Left Arm, Patient Position: Sitting, Cuff Size: Normal)   Pulse 65   Ht 5\' 8"  (1.727 m)   Wt 136 lb (61.7 kg)   SpO2 97%   BMI 20.68 kg/m     Wt Readings from Last 3 Encounters:  07/31/22 136 lb (61.7 kg)  07/16/22 136 lb (61.7 kg)  06/12/22 136 lb (61.7  kg)     GEN:  Well nourished, well developed in no acute distress HEENT: Normal NECK: No JVD; No carotid bruits CARDIAC: RRR, no murmurs, rubs, gallops RESPIRATORY:  Clear to auscultation without rales, wheezing or rhonchi  ABDOMEN: Soft, non-tender, non-distended MUSCULOSKELETAL:  No edema; No deformity  SKIN: Warm and dry NEUROLOGIC:  Alert and oriented x 3 PSYCHIATRIC:  Normal affect   ASSESSMENT:    1. SVT (supraventricular tachycardia)   2. Smoking     PLAN:    In  order of problems listed above:  SVT, cardiac monitor with no significant sustained arrhythmias, 9 beat run of SVT on metoprolol.  EF 60 to 65%.  Patient with significant fatigue, hair loss since starting metoprolol.  Stop metoprolol, monitor for recurrent symptoms.  If symptoms persist despite normalized thyroid function, ablation consideration as per EP. Current smoker, smoking cessation advised.  Follow-up in 6 months.      Medication Adjustments/Labs and Tests Ordered: Current medicines are reviewed at length with the patient today.  Concerns regarding medicines are outlined above.  No orders of the defined types were placed in this encounter.  No orders of the defined types were placed in this encounter.   Patient Instructions  Medication Instructions:   STOP Metoprolol Succinate   *If you need a refill on your cardiac medications before your next appointment, please call your pharmacy*   Lab Work:  None Ordered  If you have labs (blood work) drawn today and your tests are completely normal, you will receive your results only by: MyChart Message (if you have MyChart) OR A paper copy in the mail If you have any lab test that is abnormal or we need to change your treatment, we will call you to review the results.   Testing/Procedures:  None Ordered    Follow-Up: At [facility], you and your health needs are our priority.  As part of our continuing mission to provide you with  exceptional heart care, we have created designated Provider Care Teams.  These Care Teams include your primary Cardiologist (physician) and Advanced Practice Providers (APPs -  Physician Assistants and Nurse Practitioners) who all work together to provide you with the care you need, when you need it.  We recommend signing up for the patient portal called "MyChart".  Sign up information is provided on this After Visit Summary.  MyChart is used to connect with patients for Virtual Visits (Telemedicine).  Patients are able to view lab/test results, encounter notes, upcoming appointments, etc.  Non-urgent messages can be sent to your provider as well.   To learn more about what you can do with MyChart, go to [email or web address].    Your next appointment:   6 month(s)  Provider:   You may see Debbe Odea, MD or one of the following Advanced Practice Providers on your designated Care Team:   Nicolasa Ducking, NP Eula Listen, PA-C Cadence Fransico Michael, PA-C Charlsie Quest, NP   Signed, Debbe Odea, MD  07/31/2022 10:59 AM    [facility]

## 2022-08-15 ENCOUNTER — Encounter: Payer: Self-pay | Admitting: Internal Medicine

## 2022-08-15 DIAGNOSIS — E039 Hypothyroidism, unspecified: Secondary | ICD-10-CM

## 2022-08-21 ENCOUNTER — Other Ambulatory Visit (INDEPENDENT_AMBULATORY_CARE_PROVIDER_SITE_OTHER): Payer: Medicare HMO

## 2022-08-21 DIAGNOSIS — E039 Hypothyroidism, unspecified: Secondary | ICD-10-CM

## 2022-08-21 LAB — T4, FREE: Free T4: 1.41 ng/dL (ref 0.60–1.60)

## 2022-08-21 LAB — TSH: TSH: 0.62 u[IU]/mL (ref 0.35–5.50)

## 2022-09-16 IMAGING — DX DG CHEST 2V
2 series · 2 of 2 positions shown · non-contrast
Comparison: None.

CLINICAL DATA: Persistent fever

EXAM:
CHEST - 2 VIEW

[chest pa]
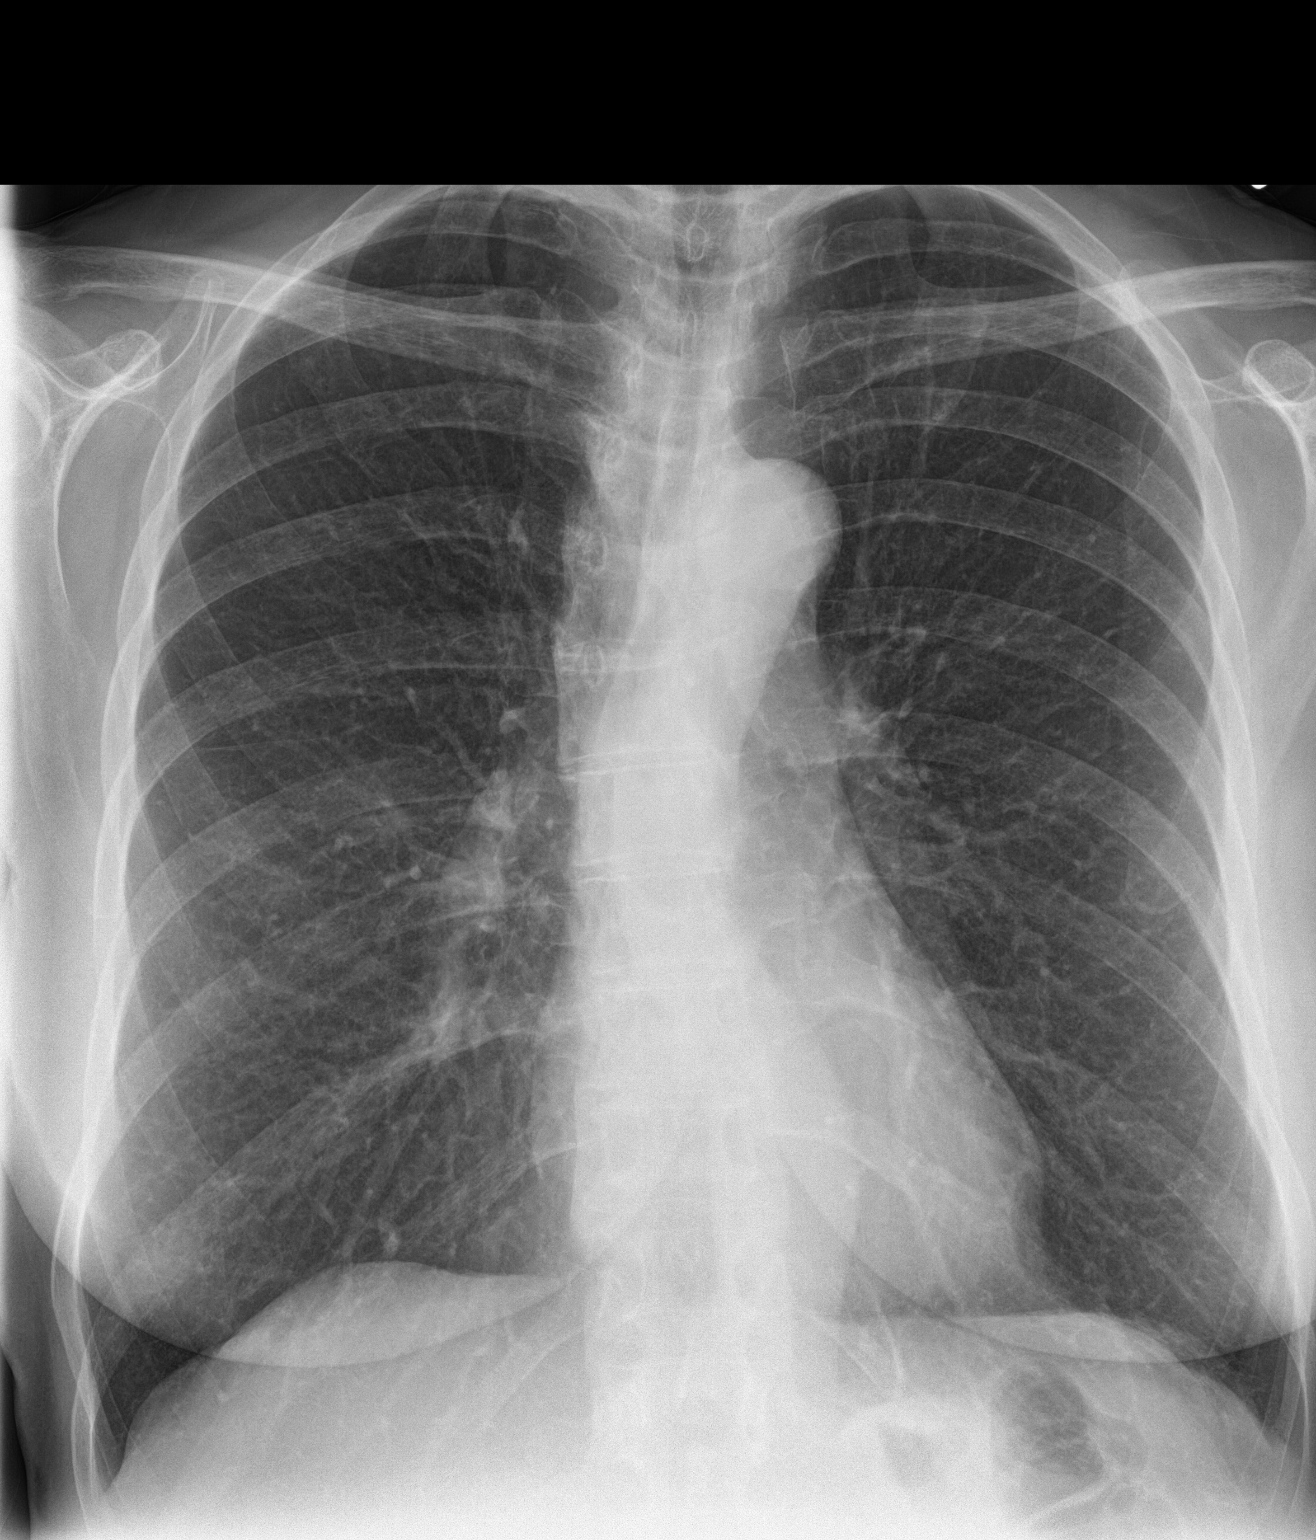

[chest lat]
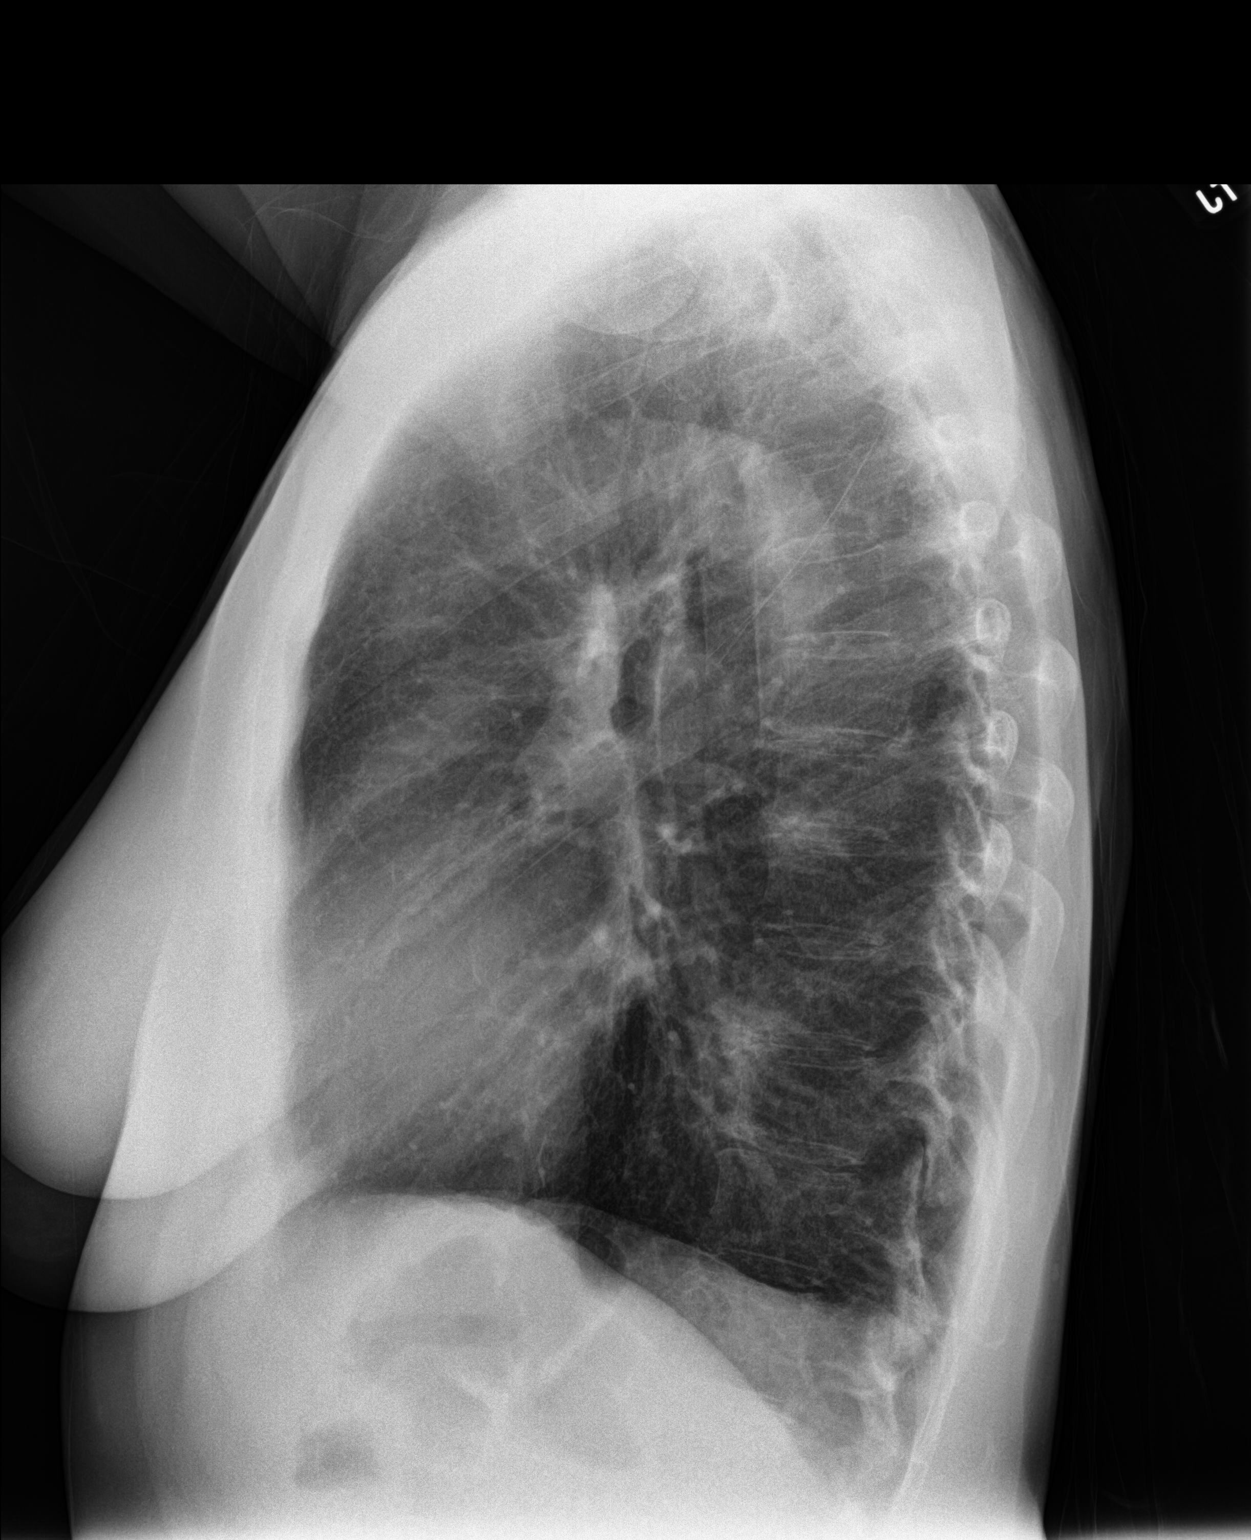

[2 of 2 positions shown; findings below may reference images not displayed]

FINDINGS: Heart size and pulmonary vascularity are normal. Emphysematous
changes in the lungs. No airspace disease or consolidation. No
pleural effusions. No pneumothorax. Mediastinal contours appear
intact. Degenerative changes in the spine.
IMPRESSION: Emphysematous changes in the lungs. No evidence of active pulmonary
disease.

## 2022-11-19 ENCOUNTER — Ambulatory Visit: Payer: Medicare HMO | Attending: Cardiology | Admitting: Cardiology

## 2022-11-19 ENCOUNTER — Encounter: Payer: Self-pay | Admitting: Cardiology

## 2022-11-19 VITALS — BP 120/82 | HR 67 | Ht 67.5 in | Wt 134.2 lb

## 2022-11-19 DIAGNOSIS — I471 Supraventricular tachycardia, unspecified: Secondary | ICD-10-CM | POA: Diagnosis not present

## 2022-11-19 NOTE — Patient Instructions (Signed)
Medication Instructions:  Your physician recommends that you continue on your current medications as directed. Please refer to the Current Medication list given to you today.  *If you need a refill on your cardiac medications before your next appointment, please call your pharmacy*  Lab Work: TODAY: TSH, T4 If you have labs (blood work) drawn today and your tests are completely normal, you will receive your results only by: MyChart Message (if you have MyChart) OR A paper copy in the mail If you have any lab test that is abnormal or we need to change your treatment, we will call you to review the results.  Follow-Up: At [facility], you and your health needs are our priority.  As part of our continuing mission to provide you with exceptional heart care, we have created designated Provider Care Teams.  These Care Teams include your primary Cardiologist (physician) and Advanced Practice Providers (APPs -  Physician Assistants and Nurse Practitioners) who all work together to provide you with the care you need, when you need it.   Your next appointment:   1 year  Provider:   Sherie Don, NP

## 2022-11-19 NOTE — Progress Notes (Signed)
  Electrophysiology Office Follow up Visit Note:    Date:  11/19/2022   ID:  Deborah Price, DOB 04-11-1950, MRN [id number]  PCP:  Karie Schwalbe, MD  [facility] Cardiologist:  Debbe Odea, MD  [facility] Electrophysiologist:  Lanier Prude, MD    Interval History:    Deborah Price is a 72 y.o. female who presents for a follow up visit.   Last seen Jul 16, 2022 for SVT.  The patient has had Valsalva responsive SVT episodes.  Excessive thyroid function was thought to be contributing to her increased burden of SVT at the last appointment.  We discussed treatment options at our last appointment and we planned to touch base after several months to revisit the burden of SVT and to finalize her treatment strategy.  She has been doing well since I last saw her.  No recurrent episodes of SVT.  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      Past medical, surgical, social and family history were reviewed.  ROS:   Please see the history of present illness.    All other systems reviewed and are negative.  EKGs/Labs/Other Studies Reviewed:    The following studies were reviewed today:  Jul 24, 2022 echo EF 60 to 65% RV function normal No significant valvular abnormalities       Physical Exam:    VS:  BP 120/82 (BP Location: Left Arm, Patient Position: Sitting, Cuff Size: Normal)   Pulse 67   Ht 5' 7.5" (1.715 m)   Wt 134 lb 4 oz (60.9 kg)   SpO2 98%   BMI 20.72 kg/m     Wt Readings from Last 3 Encounters:  11/19/22 134 lb 4 oz (60.9 kg)  07/31/22 136 lb (61.7 kg)  07/16/22 136 lb (61.7 kg)     GEN:  Well nourished, well developed in no acute distress CARDIAC: RRR, no murmurs, rubs, gallops RESPIRATORY:  Clear to auscultation without rales, wheezing or rhonchi       ASSESSMENT:    1. SVT (supraventricular tachycardia)    PLAN:    In order of problems listed above:  #SVT No recurrence with regulation of her  thyroid. Discussed treatment options again including conservative/watchful waiting, suppression using  medications and EP study with ablation. For now, I would continue with conservative/watchful waiting approach given her previous SVT episodes seem to be provoked by thyroid dysregulation.  I will recheck a TSH and free T4 today in clinic.  I have encouraged her to follow-up with her primary care physician in the next 3 months.  I will plan to have her follow-up in our clinic in 1 year or sooner as needed with an APP.  1 year follow-up with APP.     Signed, Steffanie Dunn, MD, [facility], [facility] 11/19/2022 3:45 PM    Electrophysiology [facility]

## 2022-11-20 LAB — TSH: TSH: 0.894 u[IU]/mL (ref 0.450–4.500)

## 2022-11-20 LAB — T4, FREE: Free T4: 1.98 ng/dL — ABNORMAL HIGH (ref 0.82–1.77)

## 2022-11-27 ENCOUNTER — Telehealth: Payer: Self-pay | Admitting: Internal Medicine

## 2022-11-27 ENCOUNTER — Telehealth (INDEPENDENT_AMBULATORY_CARE_PROVIDER_SITE_OTHER): Payer: Medicare HMO | Admitting: Internal Medicine

## 2022-11-27 ENCOUNTER — Encounter: Payer: Self-pay | Admitting: Internal Medicine

## 2022-11-27 DIAGNOSIS — E039 Hypothyroidism, unspecified: Secondary | ICD-10-CM

## 2022-11-27 DIAGNOSIS — U071 COVID-19: Secondary | ICD-10-CM

## 2022-11-27 DIAGNOSIS — I471 Supraventricular tachycardia, unspecified: Secondary | ICD-10-CM | POA: Diagnosis not present

## 2022-11-27 MED ORDER — LEVOTHYROXINE SODIUM 100 MCG PO TABS: 100.0000 ug | ORAL_TABLET | Freq: Every day | ORAL | 3 refills | Status: DC

## 2022-11-27 NOTE — Progress Notes (Signed)
Subjective:    Patient ID: Deborah Price, female    DOB: Jun 01, 1950, 72 y.o.   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---then noticed pain in her legs and calves (thought it was due to tiredness) Yesterday--still with the "nerve pain" in her legs Taught all her classes yesterday but it was hard Woke today at 1AM--had fever of 101.8 Went back to sleep and fever down and nerve pain was gone Still feels tired No myalgias Some congestion and blowing nose Some cough--but not much No SOB--just slight chest congestion  Hasn't used any medications--other than tylenol 500mg  1 & 2 days  Has kept up with COVID vaccines--last fall 2023  Current Outpatient Medications on File Prior to Visit  Medication Sig Dispense Refill   Cholecalciferol (VITAMIN D3) 50 MCG (2000 UT) TABS Take 1 tablet by mouth daily.     levothyroxine (SYNTHROID) 112 MCG tablet Take 1 tablet (112 mcg total) by mouth daily. 90 tablet 3   MULTIPLE VITAMIN PO Take 1 tablet by mouth daily.     nicotine polacrilex (NICOTINE MINI) 4 MG lozenge Take 1 lozenge (4 mg total) by mouth as needed for smoking cessation. 100 tablet 0   No current facility-administered medications on file prior to visit.    Allergies  Allergen Reactions   Bee Venom    Ciprofloxacin Other (See Comments)    Possible tendon problems   Codeine Nausea And Vomiting   Penicillins Hives    Past Medical History:  Diagnosis Date   Allergy    Mitral valve prolapse    Thyroid disease     Past Surgical History:  Procedure Laterality Date   ABDOMINAL HYSTERECTOMY  1996   COLONOSCOPY WITH PROPOFOL N/A 03/22/2021   Procedure: COLONOSCOPY WITH PROPOFOL;  Surgeon: Toney Reil,  MD;  Location: [facility] ENDOSCOPY;  Service: Gastroenterology;  Laterality: N/A;    Family History  Problem Relation Age of Onset   Hypertension Mother    Dementia Mother    Bladder Cancer Neg Hx    Kidney cancer Neg Hx    Heart disease Neg Hx    Cancer Neg Hx     Social History   Socioeconomic History   Marital status: Single    Spouse name: Not on file   Number of children: 0   Years of education: Not on file   Highest education level: Master's degree (e.g., MA, MS, MEng, MEd, MSW, MBA)  Occupational History   Occupation: Emergency planning/management officer: [employer]    Comment: Adjunct and private  Tobacco Use   Smoking status: Every Day    Current packs/day: 0.50    Average packs/day: 0.5 packs/day for 37.8 years (18.9 ttl pk-yrs)    Types: Cigarettes    Start date: 02/05/1985    Passive exposure: Never   Smokeless tobacco: Never   Tobacco comments:    Smokes 1/2 PPD as of 11/19/2022.  Vaping Use   Vaping status: Never Used  Substance and Sexual Activity   Alcohol use: Yes    Comment: Occasional   Drug use: No   Sexual activity: Not Currently  Other Topics Concern   Not on file  Social History Narrative   Patient is  a Dentist at [employer]      No living will   [employer] Arkeisha Pallares is health care POA   Would accept resuscitation attempts   Not sure about tube feeds   Social Determinants of Health   Financial Resource Strain: Low Risk  (06/05/2022)   Overall Financial Resource Strain (CARDIA)    Difficulty of Paying Living Expenses: Not hard at all  Food Insecurity: No Food Insecurity (06/05/2022)   Hunger Vital Sign    Worried About Running Out of Food in the Last Year: Never true    Ran Out of Food in the Last Year: Never true  Transportation Needs: No Transportation Needs (06/05/2022)   PRAPARE - Administrator, Civil Service (Medical): No    Lack of Transportation (Non-Medical): No  Physical Activity: Sufficiently Active (06/05/2022)    Exercise Vital Sign    Days of Exercise per Week: 6 days    Minutes of Exercise per Session: 100 min  Stress: No Stress Concern Present (06/05/2022)   [employer] of Occupational Health - Occupational Stress Questionnaire    Feeling of Stress : Only a little  Social Connections: Unknown (06/05/2022)   Social Connection and Isolation Panel [NHANES]    Frequency of Communication with Friends and Family: Twice a week    Frequency of Social Gatherings with Friends and Family: More than three times a week    Attends Religious Services: Patient declined    Database administrator or Organizations: No    Attends Engineer, structural: Not on file    Marital Status: Never married  Intimate Partner Violence: Not At Risk (05/07/2018)   Humiliation, Afraid, Rape, and Kick questionnaire    Fear of Current or Ex-Partner: No    Emotionally Abused: No    Physically Abused: No    Sexually Abused: No   Review of Systems No recent change in smell or taste---has noticed mild changes a few weeks ago No N/V Appetite is off some--but able to eat Hasn't had time to cook for herself--so just getting salads and chicken    Objective:   Physical Exam Constitutional:      Appearance: Normal appearance.  Pulmonary:     Effort: Pulmonary effort is normal. No respiratory distress.  Neurological:     Mental Status: She is alert.            Assessment & Plan:

## 2022-11-27 NOTE — Assessment & Plan Note (Signed)
Recent repeat thyroid function still shows elevated free T4--though normal TSH Will decrease again to Recheck TSH, free T4 in 4-6 weeks ---hold all vitamins before for a few days

## 2022-11-27 NOTE — Telephone Encounter (Signed)
Spoke to pt. Made VV with Dr Alphonsus Sias today at 145.

## 2022-11-27 NOTE — Assessment & Plan Note (Signed)
Has followed up with Dr Lalla Brothers and he retested the thyroid

## 2022-11-27 NOTE — Telephone Encounter (Signed)
Started Monday with nerve pain in her legs. Thought it was from her dance classes. Last night woke up with a fever 101.8 Took a home Covid test this am and tested positive. Muscle/nerve pain and fever are gone today. Some slight nasal congestion. She is scheduled to see Dr Alphonsus Sias tomorrow at [phone]. 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 Will keep appt for now and will cancel first thing in the morning if still improving. Asking how long does she need to wait to get her next Covid vaccine now that she is positive?

## 2022-11-27 NOTE — Assessment & Plan Note (Signed)
Mild infection thus far Discussed paxlovid--she is not excited about it. Discussed that if she is worse tomorrow, I will send the Rx Continue tylenol-- can use OTC cough med or honey Discussed cigarette cessation ER if sig SOB Isolate till next week--then can be out with mask (and limit exertion)

## 2022-11-27 NOTE — Telephone Encounter (Signed)
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 Patient would like a call back if possible.

## 2022-11-28 ENCOUNTER — Ambulatory Visit: Payer: Medicare HMO | Admitting: Internal Medicine

## 2023-01-06 ENCOUNTER — Encounter: Payer: Self-pay | Admitting: Internal Medicine

## 2023-01-12 ENCOUNTER — Encounter: Payer: Self-pay | Admitting: Internal Medicine

## 2023-01-15 ENCOUNTER — Encounter: Payer: Self-pay | Admitting: Internal Medicine

## 2023-01-15 ENCOUNTER — Other Ambulatory Visit (INDEPENDENT_AMBULATORY_CARE_PROVIDER_SITE_OTHER): Payer: Medicare HMO

## 2023-01-15 ENCOUNTER — Ambulatory Visit (INDEPENDENT_AMBULATORY_CARE_PROVIDER_SITE_OTHER): Payer: Medicare HMO | Admitting: Internal Medicine

## 2023-01-15 VITALS — BP 110/70 | HR 62 | Temp 98.6°F | Ht 67.5 in | Wt 133.0 lb

## 2023-01-15 DIAGNOSIS — E039 Hypothyroidism, unspecified: Secondary | ICD-10-CM

## 2023-01-15 DIAGNOSIS — J0110 Acute frontal sinusitis, unspecified: Secondary | ICD-10-CM

## 2023-01-15 LAB — T4, FREE: Free T4: 1.13 ng/dL (ref 0.60–1.60)

## 2023-01-15 LAB — TSH: TSH: 4.49 u[IU]/mL (ref 0.35–5.50)

## 2023-01-15 MED ORDER — BENZONATATE 200 MG PO CAPS: 200.0000 mg | ORAL_CAPSULE | Freq: Three times a day (TID) | ORAL | 0 refills | Status: DC | PRN

## 2023-01-15 MED ORDER — DOXYCYCLINE HYCLATE 100 MG PO TABS
100.0000 mg | ORAL_TABLET | Freq: Two times a day (BID) | ORAL | 0 refills | Status: DC
Start: 2023-01-15 — End: 2023-01-21

## 2023-01-15 NOTE — Progress Notes (Signed)
Subjective:    Patient ID: Deborah Price, female    DOB: 11-28-1950, 72 y.o.   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     levothyroxine (SYNTHROID) 100 MCG tablet Take 1 tablet (100 mcg total) by mouth daily. 90 tablet 3   MULTIPLE VITAMIN PO Take 1 tablet by mouth daily.     nicotine polacrilex (NICOTINE MINI) 4 MG lozenge Take 1 lozenge (4 mg total) by mouth as needed for smoking cessation. 100 tablet 0   No current facility-administered medications on file prior to visit.    Allergies  Allergen Reactions   Bee Venom    Ciprofloxacin Other (See Comments)    Possible tendon problems   Codeine Nausea And Vomiting   Penicillins Hives    Past Medical History:  Diagnosis Date   Allergy    Mitral valve prolapse    Thyroid disease     Past Surgical History:  Procedure Laterality Date   ABDOMINAL HYSTERECTOMY  1996   COLONOSCOPY WITH PROPOFOL N/A 03/22/2021   Procedure: COLONOSCOPY WITH PROPOFOL;  Surgeon: Toney Reil, MD;  Location: [facility] ENDOSCOPY;  Service: Gastroenterology;  Laterality: N/A;    Family History  Problem Relation Age of Onset   Hypertension Mother    Dementia Mother    Bladder Cancer Neg Hx    Kidney cancer Neg Hx    Heart disease Neg Hx    Cancer Neg Hx     Social History   Socioeconomic History   Marital status: Single    Spouse name: Not on file   Number of children: 0    Years of education: Not on file   Highest education level: Master's degree (e.g., MA, MS, MEng, MEd, MSW, MBA)  Occupational History   Occupation: Emergency planning/management officer: [employer]    Comment: Adjunct and private  Tobacco Use   Smoking status: Every Day    Current packs/day: 0.50    Average packs/day: 0.5 packs/day for 37.9 years (19.0 ttl pk-yrs)    Types: Cigarettes    Start date: 02/05/1985    Passive exposure: Never   Smokeless tobacco: Never   Tobacco comments:    Smokes 1/2 PPD as of 11/19/2022.  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 will   [employer] Deborah Price is health care POA   Would accept resuscitation attempts   Not sure about tube feeds   Social Determinants of Health   Financial Resource Strain: Low Risk  (01/14/2023)   Overall Financial Resource Strain (CARDIA)    Difficulty of Paying Living Expenses: Not  hard at all  Food Insecurity: No Food Insecurity (01/14/2023)   Hunger Vital Sign    Worried About Running Out of Food in the Last Year: Never true    Ran Out of Food in the Last Year: Never true  Transportation Needs: No Transportation Needs (01/14/2023)   PRAPARE - Administrator, Civil Service (Medical): No    Lack of Transportation (Non-Medical): No  Physical Activity: Sufficiently Active (01/14/2023)   Exercise Vital Sign    Days of Exercise per Week: 6 days    Minutes of Exercise per Session: 50 min  Stress: No Stress Concern Present (01/14/2023)   [employer] of Occupational Health - Occupational Stress Questionnaire    Feeling of Stress : Not at all  Social Connections: Unknown (01/14/2023)   Social Connection and Isolation Panel [NHANES]    Frequency of  Communication with Friends and Family: Once a week    Frequency of Social Gatherings with Friends and Family: Once a week    Attends Religious Services: Patient declined    Database administrator or Organizations: No    Attends Engineer, structural: Not on file    Marital Status: Never married  Intimate Partner Violence: Not At Risk (05/07/2018)   Humiliation, Afraid, Rape, and Kick questionnaire    Fear of Current or Ex-Partner: No    Emotionally Abused: No    Physically Abused: No    Sexually Abused: No   Review of Systems No N/V Able to eat but taste is not quite right since COVID     Objective:   Physical Exam Constitutional:      Appearance: Normal appearance.     Comments: Some coarse cough  HENT:     Head:     Comments: No sinus tenderness--but does feel maxillary pressure    Right Ear: Tympanic membrane and ear canal normal.     Left Ear: Tympanic membrane and ear canal normal.     Mouth/Throat:     Pharynx: No oropharyngeal exudate or posterior oropharyngeal erythema.  Pulmonary:     Effort: Pulmonary effort is normal.     Breath sounds: Normal breath sounds. No wheezing or rales.  Musculoskeletal:     Cervical back: Neck supple.  Lymphadenopathy:     Cervical: No cervical adenopathy.  Neurological:     Mental Status: She is alert.            Assessment & Plan:

## 2023-01-15 NOTE — Assessment & Plan Note (Signed)
Some degree of bronchial involvement but fortunately no wheezing and not tight Will try benzonatate prn and doxy 100 bid x 7 days

## 2023-01-20 ENCOUNTER — Encounter: Payer: Self-pay | Admitting: Internal Medicine

## 2023-01-21 MED ORDER — PREDNISONE 20 MG PO TABS: 40.0000 mg | ORAL_TABLET | Freq: Every day | ORAL | 0 refills | Status: DC

## 2023-01-21 MED ORDER — DOXYCYCLINE HYCLATE 100 MG PO TABS: 100.0000 mg | ORAL_TABLET | Freq: Two times a day (BID) | ORAL | 0 refills | Status: DC

## 2023-02-05 ENCOUNTER — Encounter: Payer: Self-pay | Admitting: Cardiology

## 2023-02-05 ENCOUNTER — Ambulatory Visit: Payer: Medicare HMO | Attending: Cardiology | Admitting: Cardiology

## 2023-02-05 VITALS — BP 104/82 | HR 61 | Ht 68.0 in | Wt 137.0 lb

## 2023-02-05 DIAGNOSIS — F17200 Nicotine dependence, unspecified, uncomplicated: Secondary | ICD-10-CM | POA: Diagnosis not present

## 2023-02-05 DIAGNOSIS — I471 Supraventricular tachycardia, unspecified: Secondary | ICD-10-CM | POA: Diagnosis not present

## 2023-02-05 NOTE — Patient Instructions (Signed)
Medication Instructions:   Your physician recommends that you continue on your current medications as directed. Please refer to the Current Medication list given to you today.  *If you need a refill on your cardiac medications before your next appointment, please call your pharmacy*   Lab Work:  None Ordered  If you have labs (blood work) drawn today and your tests are completely normal, you will receive your results only by: MyChart Message (if you have MyChart) OR A paper copy in the mail If you have any lab test that is abnormal or we need to change your treatment, we will call you to review the results.   Testing/Procedures:  None ordered   Follow-Up: At [facility], you and your health needs are our priority.  As part of our continuing mission to provide you with exceptional heart care, we have created designated Provider Care Teams.  These Care Teams include your primary Cardiologist (physician) and Advanced Practice Providers (APPs -  Physician Assistants and Nurse Practitioners) who all work together to provide you with the care you need, when you need it.  We recommend signing up for the patient portal called "MyChart".  Sign up information is provided on this After Visit Summary.  MyChart is used to connect with patients for Virtual Visits (Telemedicine).  Patients are able to view lab/test results, encounter notes, upcoming appointments, etc.  Non-urgent messages can be sent to your provider as well.   To learn more about what you can do with MyChart, go to [email or web address].    Your next appointment:   12 months   Provider:   You may see Debbe Odea, MD or one of the following Advanced Practice Providers on your designated Care Team:   Nicolasa Ducking, NP Eula Listen, PA-C Cadence Fransico Michael, PA-C Charlsie Quest, NP Carlos Levering, NP

## 2023-02-05 NOTE — Progress Notes (Signed)
Cardiology Office Note:    Date:  02/05/2023   ID:  Deborah Price, DOB 12-27-1950, MRN [id number]  PCP:  Karie Schwalbe, MD   [facility] Providers Cardiologist:  Debbe Odea, MD Electrophysiologist:  Lanier Prude, MD     Referring MD: Karie Schwalbe, MD   Chief Complaint  Patient presents with   Follow-up    Patient denies new or acute cardiac problems/concerns today.      History of Present Illness:    Deborah Price is a 72 y.o. female with a hx of SVT, current smoker x 30+ years, hypothyroidism who presents for follow-up.  Previously seen due to palpitations and SVT in the context of abnormal thyroid function.  Thyroid medications have been adjusted, thyroid function control.  Has not had any further episodes of palpitations.  Tried metoprolol in the past, did not tolerate due to fatigue and hair loss.  Metoprolol was stopped.  She still smokes.  Otherwise doing okay, no concerns at this time.  Prior notes/testing She teaches ballet at [employer], worried about dizziness, fatigue from medications. Cardiac monitor 05/2022 1 episode of SVT lasting 9 beats.  No significant sustained arrhythmias. Echocardiogram 07/2022 EF 60 to 65%. History of fatigue, hair loss with metoprolol.   Past Medical History:  Diagnosis Date   Allergy    Mitral valve prolapse    Thyroid disease     Past Surgical History:  Procedure Laterality Date   ABDOMINAL HYSTERECTOMY  1996   COLONOSCOPY WITH PROPOFOL N/A 03/22/2021   Procedure: COLONOSCOPY WITH PROPOFOL;  Surgeon: Toney Reil, MD;  Location[facility] ENDOSCOPY;  Service: Gastroenterology;  Laterality: N/A;    Current Medications: Current Meds  Medication Sig   Cholecalciferol (VITAMIN D3) 50 MCG (2000 UT) TABS Take 1 tablet by mouth daily.   levothyroxine (SYNTHROID) 100 MCG tablet Take 1 tablet (100 mcg total) by mouth daily.   MULTIPLE VITAMIN PO Take 1 tablet by mouth daily.     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 will   [employer] Deborah Price is health care POA   Would accept resuscitation attempts   Not sure about tube feeds   Social Determinants of Health   Financial Resource Strain: Low Risk  (01/14/2023)   Overall Financial Resource Strain (CARDIA)    Difficulty  of Paying Living Expenses: Not hard at all  Food Insecurity: No Food Insecurity (01/14/2023)   Hunger Vital Sign    Worried About Running Out of Food in the Last Year: Never true    Ran Out of Food in the Last Year: Never true  Transportation Needs: No Transportation Needs (01/14/2023)   PRAPARE - Administrator, Civil Service (Medical): No    Lack of Transportation (Non-Medical): No  Physical Activity: Sufficiently Active (01/14/2023)   Exercise Vital Sign    Days of Exercise per Week: 6 days    Minutes of Exercise per Session: 50 min  Stress: No Stress Concern Present (01/14/2023)   [employer] of Occupational Health - Occupational Stress  Questionnaire    Feeling of Stress : Not at all  Social Connections: Unknown (01/14/2023)   Social Connection and Isolation Panel [NHANES]    Frequency of Communication with Friends and Family: Once a week    Frequency of Social Gatherings with Friends and Family: Once a week    Attends Religious Services: Patient declined    Database administrator or Organizations: No    Attends Engineer, structural: Not on file    Marital Status: Never married     Family History: The patient's family history includes Dementia in her mother; Hypertension in her mother. There is no history of Bladder Cancer, Kidney cancer, Heart disease, or Cancer.  ROS:   Please see the history of present illness.     All other systems reviewed and are negative.  EKGs/Labs/Other Studies Reviewed:    The following studies were reviewed today:   EKG:  EKG is  ordered today.  The ekg ordered today demonstrates normal sinus rhythm, incomplete right bundle branch block  Recent Labs: 06/03/2022: ALT 11; BUN 15; Creatinine, Ser 0.75; Hemoglobin 15.7; Magnesium 1.8; Platelets 260; Potassium 3.7; Sodium 140 01/15/2023: TSH 4.49  Recent Lipid Panel    Component Value Date/Time   CHOL 193 01/28/2018 1143   CHOL 195 02/06/2015 1107   TRIG 91 01/28/2018 1143   HDL 56 01/28/2018 1143   HDL 56 02/06/2015 1107   CHOLHDL 3.4 01/28/2018 1143   VLDL 21 11/06/2015 1109   LDLCALC 118 (H) 01/28/2018 1143     Risk Assessment/Calculations:             Physical Exam:    VS:  BP 104/82 (BP Location: Left Arm, Patient Position: Sitting, Cuff Size: Normal)   Pulse 61   Ht 5\' 8"  (1.727 m)   Wt 137 lb (62.1 kg)   SpO2 99%   BMI 20.83 kg/m     Wt Readings from Last 3 Encounters:  02/05/23 137 lb (62.1 kg)  01/15/23 133 lb (60.3 kg)  11/19/22 134 lb 4 oz (60.9 kg)     GEN:  Well nourished, well developed in no acute distress HEENT: Normal NECK: No JVD; No carotid bruits CARDIAC: RRR, no murmurs, rubs,  gallops RESPIRATORY:  Clear to auscultation without rales, wheezing or rhonchi  ABDOMEN: Soft, non-tender, non-distended MUSCULOSKELETAL:  No edema; No deformity  SKIN: Warm and dry NEUROLOGIC:  Alert and oriented x 3 PSYCHIATRIC:  Normal affect   ASSESSMENT:    1. SVT (supraventricular tachycardia) (HCC)   2. Smoking     PLAN:    In order of problems listed above:  SVT, no recurrence with management of thyroid dysfunction.  Did not tolerate metoprolol with symptoms of fatigue, hair loss. EF 60 to 65%.  Continue  to monitor patient off AV nodal agents. Current smoker, smoking cessation advised.  Follow-up in 12 months.       Medication Adjustments/Labs and Tests Ordered: Current medicines are reviewed at length with the patient today.  Concerns regarding medicines are outlined above.  Orders Placed This Encounter  Procedures   EKG 12-Lead   No orders of the defined types were placed in this encounter.   Patient Instructions  Medication Instructions:   Your physician recommends that you continue on your current medications as directed. Please refer to the Current Medication list given to you today.  *If you need a refill on your cardiac medications before your next appointment, please call your pharmacy*   Lab Work:  None Ordered If you have labs (blood work) drawn today and your tests are completely normal, you will receive your results only by: MyChart Message (if you have MyChart) OR A paper copy in the mail If you have any lab test that is abnormal or we need to change your treatment, we will call you to review the results.   Testing/Procedures:  None ordered   Follow-Up: At [facility], you and your health needs are our priority.  As part of our continuing mission to provide you with exceptional heart care, we have created designated Provider Care Teams.  These Care Teams include your primary Cardiologist (physician) and Advanced Practice  Providers (APPs -  Physician Assistants and Nurse Practitioners) who all work together to provide you with the care you need, when you need it.  We recommend signing up for the patient portal called "MyChart".  Sign up information is provided on this After Visit Summary.  MyChart is used to connect with patients for Virtual Visits (Telemedicine).  Patients are able to view lab/test results, encounter notes, upcoming appointments, etc.  Non-urgent messages can be sent to your provider as well.   To learn more about what you can do with MyChart, go to [email or web address].    Your next appointment:   12 month(s)  Provider:   You may see Debbe Odea, MD or one of the following Advanced Practice Providers on your designated Care Team:   Nicolasa Ducking, NP Eula Listen, PA-C Cadence Fransico Michael, PA-C Charlsie Quest, NP Carlos Levering, NP   Signed, Debbe Odea, MD  02/05/2023 12:30 PM    [facility]

## 2023-02-17 ENCOUNTER — Ambulatory Visit (INDEPENDENT_AMBULATORY_CARE_PROVIDER_SITE_OTHER): Payer: Medicare HMO | Admitting: Internal Medicine

## 2023-02-17 ENCOUNTER — Encounter: Payer: Self-pay | Admitting: Internal Medicine

## 2023-02-17 VITALS — BP 118/70 | HR 60 | Temp 98.8°F | Ht 67.0 in | Wt 138.0 lb

## 2023-02-17 DIAGNOSIS — Z0000 Encounter for general adult medical examination without abnormal findings: Secondary | ICD-10-CM

## 2023-02-17 DIAGNOSIS — F17210 Nicotine dependence, cigarettes, uncomplicated: Secondary | ICD-10-CM | POA: Diagnosis not present

## 2023-02-17 DIAGNOSIS — F39 Unspecified mood [affective] disorder: Secondary | ICD-10-CM | POA: Diagnosis not present

## 2023-02-17 DIAGNOSIS — I471 Supraventricular tachycardia, unspecified: Secondary | ICD-10-CM

## 2023-02-17 DIAGNOSIS — E039 Hypothyroidism, unspecified: Secondary | ICD-10-CM

## 2023-02-17 LAB — COMPREHENSIVE METABOLIC PANEL
ALT: 10 U/L (ref 0–35)
AST: 16 U/L (ref 0–37)
Albumin: 4 g/dL (ref 3.5–5.2)
Alkaline Phosphatase: 71 U/L (ref 39–117)
BUN: 12 mg/dL (ref 6–23)
CO2: 31 meq/L (ref 19–32)
Calcium: 9.2 mg/dL (ref 8.4–10.5)
Chloride: 103 meq/L (ref 96–112)
Creatinine, Ser: 0.8 mg/dL (ref 0.40–1.20)
GFR: 74.44 mL/min (ref >60.00)
Glucose, Bld: 80 mg/dL (ref 70–99)
Potassium: 4.2 meq/L (ref 3.5–5.1)
Sodium: 139 meq/L (ref 135–145)
Total Bilirubin: 0.7 mg/dL (ref 0.2–1.2)
Total Protein: 7.1 g/dL (ref 6.0–8.3)

## 2023-02-17 LAB — CBC
HCT: 47 % — ABNORMAL HIGH (ref 36.0–46.0)
Hemoglobin: 15.6 g/dL — ABNORMAL HIGH (ref 12.0–15.0)
MCHC: 33.2 g/dL (ref 30.0–36.0)
MCV: 101.3 fL — ABNORMAL HIGH (ref 78.0–100.0)
Platelets: 302 10*3/uL (ref 150.0–400.0)
RBC: 4.63 Mil/uL (ref 3.87–5.11)
RDW: 14.4 % (ref 11.5–15.5)
WBC: 6.7 10*3/uL (ref 4.0–10.5)

## 2023-02-17 LAB — T4, FREE: Free T4: 1.16 ng/dL (ref 0.60–1.60)

## 2023-02-17 LAB — VITAMIN B12: Vitamin B-12: 262 pg/mL (ref 211–911)

## 2023-02-17 LAB — TSH: TSH: 3.51 u[IU]/mL (ref 0.35–5.50)

## 2023-02-17 MED ORDER — KETOCONAZOLE 2 % EX CREA: 1.0000 | TOPICAL_CREAM | Freq: Every day | CUTANEOUS | 1 refills | Status: DC | PRN

## 2023-02-17 NOTE — Assessment & Plan Note (Signed)
I have personally reviewed the Medicare Annual Wellness questionnaire and have noted 1. The patient's medical and social history 2. Their use of alcohol, tobacco or illicit drugs 3. Their current medications and supplements 4. The patient's functional ability including ADL's, fall risks, home safety risks and hearing or visual             impairment. 5. Diet and physical activities 6. Evidence for depression or mood disorders  The patients weight, height, BMI and visual acuity have been recorded in the chart I have made referrals, counseling and provided education to the patient based review of the above and I have provided the pt with a written personalized care plan for preventive services.  I have provided you with a copy of your personalized plan for preventive services. Please take the time to review along with your updated medication list.  PROBABLY done with colonoscopies Mammogram every 1-2 years till 59 at least No pap due to age Discussed cigarette cessation UTD with imms---except needs updated COVID Very active

## 2023-02-17 NOTE — Assessment & Plan Note (Signed)
Seems to be euthyroid now Will recheck labs on lower dose of levothyroxine--100 mcg

## 2023-02-17 NOTE — Assessment & Plan Note (Signed)
No symptoms now Likely from too much levothyroxine

## 2023-02-17 NOTE — Assessment & Plan Note (Signed)
Discussed using nicotine replacement Referrral for lung cancer screening

## 2023-02-17 NOTE — Assessment & Plan Note (Signed)
Doing better with parents death Ready to stop working when the time comes No meds indicated

## 2023-02-17 NOTE — Progress Notes (Signed)
Hearing Screening - Comments:: Passed whisper test Vision Screening - Comments:: February 2024  

## 2023-02-17 NOTE — Progress Notes (Signed)
Subjective:    Patient ID: Deborah Price, female    DOB: 15-Jun-1950, 72 y.o.   MRN: [id number]  HPI Here for Medicare wellness visit and follow up of chronic health conditions Reviewed advanced directives Reviewed other doctors---Dr Agbor-Etang--cardiology, Dr Sarajane Jews cardiology, Dr Danie Binder, [facility], [facility] No hospitalizations or surgery in the past year---just one ER visit with the [facility] Still smoking--discussed Enjoys occasional glass of wine No falls Some trouble with bifocals--may split glasses Hearing is olay--but some trouble hearing students in class---discussed audiologist Independent with instrumental ADLs No sig memory issues  Feels some better--but not back to normal Some head congestion Cough is better--may notice more after increased activity (with ballet instruction) Also with some nasal drainage No SOB now  Did go 4 days without smoking over Thanksgiving--when with family No withdrawal--but then went back (max 1/2 PPD) Feels closer to ready to finally work on it Plans to do deep clean on house and eventually get rid of carpets  No more palpitations Had noticed hair loss and fatigue then as well This is better now---?related to thyroid overtreatment No chest pain or SOB No dizziness or syncope No longer on metoprolol  Slight trouble with balance More careful with ballet turns  Has rash between toes --"athlete's foot" Careful about cleaning--shoes/ballet shoes Slight blisters Tried old topical iodine--not clearly helpful  Has mole and skin tag on back New spot on right cheek Discussed establishing with derm  Some mild ongoing mood issues Mostly over the grieving for parents Working though retirement issues--still enjoys Social research officer, government No regular depression and no anhedonia  Current Outpatient Medications on File Prior to Visit  Medication Sig Dispense Refill   Cholecalciferol (VITAMIN D3) 50 MCG (2000 UT) TABS Take 1 tablet by mouth  daily.     levothyroxine (SYNTHROID) 100 MCG tablet Take 1 tablet (100 mcg total) by mouth daily. 90 tablet 3   MULTIPLE VITAMIN PO Take 1 tablet by mouth daily.     nicotine polacrilex (NICOTINE MINI) 4 MG lozenge Take 1 lozenge (4 mg total) by mouth as needed for smoking cessation. 100 tablet 0   No current facility-administered medications on file prior to visit.    Allergies  Allergen Reactions   Bee Venom    Ciprofloxacin Other (See Comments)    Possible tendon problems   Codeine Nausea And Vomiting   Penicillins Hives    Past Medical History:  Diagnosis Date   Allergy    Mitral valve prolapse    Thyroid disease     Past Surgical History:  Procedure Laterality Date   ABDOMINAL HYSTERECTOMY  1996   COLONOSCOPY WITH PROPOFOL N/A 03/22/2021   Procedure: COLONOSCOPY WITH PROPOFOL;  Surgeon: Toney Reil, MD;  Location: [facility] ENDOSCOPY;  Service: Gastroenterology;  Laterality: N/A;    Family History  Problem Relation Age of Onset   Hypertension Mother    Dementia Mother    Bladder Cancer Neg Hx    Kidney cancer Neg Hx    Heart disease Neg Hx    Cancer Neg Hx     Social History   Socioeconomic History   Marital status: Single    Spouse name: Not on file   Number of children: 0   Years of education: Not on file   Highest education level: Master's degree (e.g., MA, MS, MEng, MEd, MSW, MBA)  Occupational History   Occupation: Emergency planning/management officer: [employer]    Comment: Adjunct and private  Tobacco  Use   Smoking status: Every Day    Current packs/day: 0.50    Average packs/day: 0.5 packs/day for 38.0 years (19.0 ttl pk-yrs)    Types: Cigarettes    Start date: 02/05/1985    Passive exposure: Never   Smokeless tobacco: Never   Tobacco comments:    Smokes 1/2 PPD as of 11/19/2022.  Vaping Use   Vaping status: Never Used  Substance and Sexual Activity   Alcohol use: Yes    Comment: Occasional   Drug use: No   Sexual activity: Not Currently   Other Topics Concern   Not on file  Social History Narrative   Patient is a Dentist at [employer]      No living will   [employer] Florabelle Crump is health care POA   Would accept resuscitation attempts   Not sure about tube feeds   Social Determinants of Health   Financial Resource Strain: Low Risk  (01/14/2023)   Overall Financial Resource Strain (CARDIA)    Difficulty of Paying Living Expenses: Not hard at all  Food Insecurity: No Food Insecurity (01/14/2023)   Hunger Vital Sign    Worried About Running Out of Food in the Last Year: Never true    Ran Out of Food in the Last Year: Never true  Transportation Needs: No Transportation Needs (01/14/2023)   PRAPARE - Administrator, Civil Service (Medical): No    Lack of Transportation (Non-Medical): No  Physical Activity: Sufficiently Active (01/14/2023)   Exercise Vital Sign    Days of Exercise per Week: 6 days    Minutes of Exercise per Session: 50 min  Stress: No Stress Concern Present (01/14/2023)   [employer] of Occupational Health - Occupational Stress Questionnaire    Feeling of Stress : Not at all  Social Connections: Unknown (01/14/2023)   Social Connection and Isolation Panel [NHANES]    Frequency of Communication with Friends and Family: Once a week    Frequency of Social Gatherings with Friends and Family: Once a week    Attends Religious Services: Patient declined    Database administrator or Organizations: No    Attends Engineer, structural: Not on file    Marital Status: Never married  Intimate Partner Violence: Not At Risk (05/07/2018)   Humiliation, Afraid, Rape, and Kick questionnaire    Fear of Current or Ex-Partner: No    Emotionally Abused: No    Physically Abused: No    Sexually Abused: No   Review of Systems Appetite is good Now cooking more Weight stable Sleeps okay Wears seat belt Teeth are okay---overdue for dentist (does plan to start at [facility]) No striking  back or joint issues---?slight left sciatica (very brief)    Objective:   Physical Exam Constitutional:      Appearance: Normal appearance.  HENT:     Right Ear: Tympanic membrane and ear canal normal.     Left Ear: Tympanic membrane and ear canal normal.     Mouth/Throat:     Pharynx: No oropharyngeal exudate or posterior oropharyngeal erythema.  Eyes:     Conjunctiva/sclera: Conjunctivae normal.     Pupils: Pupils are equal, round, and reactive to light.  Cardiovascular:     Rate and Rhythm: Normal rate and regular rhythm.     Pulses: Normal pulses.     Heart sounds: No murmur heard.    No gallop.  Pulmonary:     Effort: Pulmonary effort is normal.  Breath sounds: Normal breath sounds. No wheezing or rales.  Abdominal:     Palpations: Abdomen is soft.     Tenderness: There is no abdominal tenderness.  Musculoskeletal:     Cervical back: Neck supple.     Right lower leg: No edema.     Left lower leg: No edema.  Lymphadenopathy:     Cervical: No cervical adenopathy.  Skin:    Comments: Mild intertrginous rash between right toes  Neurological:     General: No focal deficit present.     Mental Status: She is alert and oriented to person, place, and time.     Comments: Word naming---10/1 minute Recall -- 2/3  Psychiatric:        Mood and Affect: Mood normal.        Behavior: Behavior normal.            Assessment & Plan:

## 2023-02-26 ENCOUNTER — Encounter: Payer: Self-pay | Admitting: *Deleted

## 2023-02-26 ENCOUNTER — Other Ambulatory Visit: Payer: Self-pay

## 2023-02-26 DIAGNOSIS — Z87891 Personal history of nicotine dependence: Secondary | ICD-10-CM

## 2023-02-26 DIAGNOSIS — F17210 Nicotine dependence, cigarettes, uncomplicated: Secondary | ICD-10-CM

## 2023-02-26 DIAGNOSIS — Z122 Encounter for screening for malignant neoplasm of respiratory organs: Secondary | ICD-10-CM

## 2023-03-02 ENCOUNTER — Encounter: Payer: Self-pay | Admitting: Adult Health

## 2023-03-02 ENCOUNTER — Ambulatory Visit: Payer: Medicare HMO | Admitting: Adult Health

## 2023-03-02 DIAGNOSIS — F17210 Nicotine dependence, cigarettes, uncomplicated: Secondary | ICD-10-CM

## 2023-03-02 NOTE — Progress Notes (Signed)
  Virtual Visit via Telephone Note  I connected with Deborah Price , 03/02/23 10:02 AM by a telemedicine application and verified that I am speaking with the correct person using two identifiers.  Location: Patient: home Provider: home   I discussed the limitations of evaluation and management by telemedicine and the availability of in person appointments. The patient expressed understanding and agreed to proceed.   Shared Decision Making Visit Lung Cancer Screening Program [phone])   Eligibility: 72 y.o. Pack Years Smoking History Calculation = 20 pack years  (# packs/per year x # years smoked) Recent History of coughing up blood  no Unexplained weight loss? no ( >Than 15 pounds within the last 6 months ) Prior History Lung / other cancer no (Diagnosis within the last 5 years already requiring surveillance chest CT Scans). Smoking Status Current Smoker  Visit Components: Discussion included one or more decision making aids. YES Discussion included risk/benefits of screening. YES Discussion included potential follow up diagnostic testing for abnormal scans. YES Discussion included meaning and risk of over diagnosis. YES Discussion included meaning and risk of False Positives. YES Discussion included meaning of total radiation exposure. YES  Counseling Included: Importance of adherence to annual lung cancer LDCT screening. YES Impact of comorbidities on ability to participate in the program. YES Ability and willingness to under diagnostic treatment. YES  Smoking Cessation Counseling: Current Smokers:  Discussed importance of smoking cessation. yes Information about tobacco cessation classes and interventions provided to patient. yes Patient provided with "ticket" for LDCT Scan. yes Symptomatic Patient. no Diagnosis Code: Tobacco Use Z72.0 Asymptomatic Patient yes  Counseling (Intermediate counseling: > three minutes counseling) N8295   Z12.2-Screening of respiratory  organs Z87.891-Personal history of nicotine dependence   Deborah Price 03/02/23

## 2023-03-02 NOTE — Patient Instructions (Signed)

## 2023-03-03 ENCOUNTER — Ambulatory Visit
Admission: RE | Admit: 2023-03-03 | Discharge: 2023-03-03 | Disposition: A | Discharge status: Home / Self Care | Payer: Medicare HMO | Source: Ambulatory Visit | Attending: Acute Care | Admitting: Acute Care

## 2023-03-03 DIAGNOSIS — Z87891 Personal history of nicotine dependence: Secondary | ICD-10-CM | POA: Diagnosis not present

## 2023-03-03 DIAGNOSIS — F17210 Nicotine dependence, cigarettes, uncomplicated: Secondary | ICD-10-CM | POA: Diagnosis not present

## 2023-03-03 DIAGNOSIS — Z122 Encounter for screening for malignant neoplasm of respiratory organs: Secondary | ICD-10-CM | POA: Insufficient documentation

## 2023-03-19 ENCOUNTER — Telehealth: Payer: Self-pay | Admitting: Acute Care

## 2023-03-19 NOTE — Telephone Encounter (Signed)
 Patient had first LDCT on 03/03/2023 that resulted as a LR2. Patient has a 15.11mm nodule. After review Lauraine Lites NP recommends a 6 month follow up scan, due 09/02/2023 to re-evaluate.     IMPRESSION: Lung-RADS 2, benign appearance or behavior. Continue annual screening with low-dose chest CT without contrast in 12 months.   Aortic Atherosclerosis (ICD10-I70.0) and Emphysema (ICD10-J43.9).     Electronically Signed   By: Selinda DELENA Blue M.D.   On: 03/16/2023 09:35

## 2023-03-20 ENCOUNTER — Other Ambulatory Visit: Payer: Self-pay

## 2023-03-20 DIAGNOSIS — Z87891 Personal history of nicotine dependence: Secondary | ICD-10-CM

## 2023-03-20 DIAGNOSIS — R911 Solitary pulmonary nodule: Secondary | ICD-10-CM

## 2023-03-20 NOTE — Telephone Encounter (Signed)
 Called and left VM for pt

## 2023-03-20 NOTE — Telephone Encounter (Signed)
 Spoke with patient by phone to review results of recent LDCT.  Scan read as LR2 but pulmonary provider, Lauraine Lites, NP recommends 6 months follow up for GG nodule 15.51mm.  Patient states she did have some increased chest congestion about the time of the LDCT appointment.  She also had covid in September 2024.  She is feeling well now.  She is in agreement to repeat the LDCT in June 2025 and we will call her closer to date to schedule.  Atherosclerosis and Emphysema.  Patient is not on statin medication and will discuss with PCP at next appointment.  Results/plan faxed to PCP and order placed for follow up LDCT for nodule.

## 2023-04-03 ENCOUNTER — Encounter: Payer: Self-pay | Admitting: Internal Medicine

## 2023-04-03 DIAGNOSIS — E039 Hypothyroidism, unspecified: Secondary | ICD-10-CM

## 2023-05-05 ENCOUNTER — Other Ambulatory Visit (INDEPENDENT_AMBULATORY_CARE_PROVIDER_SITE_OTHER): Payer: Medicare HMO

## 2023-05-05 ENCOUNTER — Encounter: Payer: Self-pay | Admitting: Internal Medicine

## 2023-05-05 ENCOUNTER — Other Ambulatory Visit: Payer: Medicare HMO

## 2023-05-05 DIAGNOSIS — E039 Hypothyroidism, unspecified: Secondary | ICD-10-CM | POA: Diagnosis not present

## 2023-05-05 LAB — LIPID PANEL
Cholesterol: 185 mg/dL (ref 0–200)
HDL: 52.5 mg/dL (ref >39.00)
LDL Cholesterol: 113 mg/dL — ABNORMAL HIGH (ref 0–99)
NonHDL: 132.83
Total CHOL/HDL Ratio: 4
Triglycerides: 99 mg/dL (ref 0.0–149.0)
VLDL: 19.8 mg/dL (ref 0.0–40.0)

## 2023-05-05 LAB — T4, FREE: Free T4: 1.32 ng/dL (ref 0.60–1.60)

## 2023-05-05 LAB — TSH: TSH: 8.3 u[IU]/mL — ABNORMAL HIGH (ref 0.35–5.50)

## 2023-05-07 ENCOUNTER — Other Ambulatory Visit: Payer: Medicare HMO

## 2023-05-09 MED ORDER — LEVOTHYROXINE SODIUM 112 MCG PO TABS: 112.0000 ug | ORAL_TABLET | Freq: Every day | ORAL | 3 refills | Status: DC

## 2023-05-21 ENCOUNTER — Encounter: Payer: Self-pay | Admitting: Internal Medicine

## 2023-05-21 ENCOUNTER — Telehealth: Payer: Self-pay | Admitting: Internal Medicine

## 2023-05-21 NOTE — Telephone Encounter (Signed)
Will be on the look out for it. 

## 2023-05-21 NOTE — Telephone Encounter (Signed)
 Patient dropped off document Surgical Clearance, to be filled out by provider. Patient requested to send it back via Call Patient to pick up within 5-days. Document is located in providers tray at front office.Please advise at Mobile [phone] (mobile)

## 2023-05-21 NOTE — Telephone Encounter (Signed)
 Copied from CRM [id number]. Topic: Medical Record Request - Other >> May 21, 2023  4:34 PM Deborah Price wrote: Reason for CRM: Patient calling to let clinic know that she will be dropping off documentation for Dr. Alphonsus Sias to go over at his earliest convenience.

## 2023-05-22 NOTE — Telephone Encounter (Signed)
 Forms from pt placed in Dr Karle Starch inbox on his desk.

## 2023-05-22 NOTE — Telephone Encounter (Signed)
 Forms in Dr Karle Starch Inbox on his desk. There is a MyChart message about this, also. I will close this message.

## 2023-06-01 DIAGNOSIS — K090 Developmental odontogenic cysts: Secondary | ICD-10-CM | POA: Diagnosis not present

## 2023-06-30 ENCOUNTER — Telehealth: Payer: Self-pay

## 2023-06-30 DIAGNOSIS — E039 Hypothyroidism, unspecified: Secondary | ICD-10-CM

## 2023-06-30 NOTE — Telephone Encounter (Signed)
 Spoke ti pt. She would like to retest. Made appt for tomorrow (07-01-23). Please place future orders

## 2023-06-30 NOTE — Telephone Encounter (Signed)
 Spoke to pt. She said after having the oral procedure, she is still having fatigue, not thinking clearly, balance is off. Was feeling off before the change in manufacturer for the levothyroxine. Has not had palpitations since the manufacturer change. These feeling have been happening since the dosage was lowered to 100 mcg.

## 2023-06-30 NOTE — Telephone Encounter (Signed)
 Spoke to pt about another issues.  She stated she had a question about some symptoms she is having that started today after moving a trash can. Pain in lower pelvic area. No dysuria. Has some increased urination, but has been on a liquid to soft diet.   She will keep an eye on it and [facility] [address] as she cannot come to the office in the morning.

## 2023-06-30 NOTE — Telephone Encounter (Signed)
 Copied from CRM [id number]. Topic: Clinical - Request for Lab/Test Order >> Jun 30, 2023 10:10 AM Artemio Larry wrote: Reason for CRM: Patient request call back at [phone] regarding lab order for thyroid check, wants to know if Dr. Joelle Musca would be following this or her new provider Tabitha. If calling today please call patient before 11:30am or after 2:30pm.

## 2023-07-01 ENCOUNTER — Other Ambulatory Visit (INDEPENDENT_AMBULATORY_CARE_PROVIDER_SITE_OTHER)

## 2023-07-01 DIAGNOSIS — E039 Hypothyroidism, unspecified: Secondary | ICD-10-CM

## 2023-07-01 NOTE — Addendum Note (Signed)
 Addended by: Gerry Krone on: 07/01/2023 08:32 AM   Modules accepted: Orders

## 2023-07-01 NOTE — Addendum Note (Signed)
 Addended by: Curt Dover I on: 07/01/2023 07:25 AM   Modules accepted: Orders

## 2023-07-01 NOTE — Telephone Encounter (Signed)
 Order placed

## 2023-07-02 LAB — T4, FREE: Free T4: 1.34 ng/dL (ref 0.60–1.60)

## 2023-07-02 LAB — TSH: TSH: 2.04 u[IU]/mL (ref 0.35–5.50)

## 2023-07-03 ENCOUNTER — Encounter: Payer: Self-pay | Admitting: Internal Medicine

## 2023-10-02 ENCOUNTER — Ambulatory Visit: Admission: RE | Admit: 2023-10-02 | Source: Ambulatory Visit

## 2023-10-12 ENCOUNTER — Ambulatory Visit: Payer: Self-pay

## 2023-10-12 NOTE — Telephone Encounter (Signed)
 I will assess her concerns at tomorrow's appointment

## 2023-10-12 NOTE — Telephone Encounter (Signed)
 Pt stated she has been very sleep for a while. She mention that she takes levothyroxine  (SYNTHROID ) 112 MCG tablet [[id number]]. Had palpations in the past. She stated a good time to call is tomorrow morning     Spoke with patient, patient says that she already has an appt scheduled for tomorrow with Dr. Jimmy. Declined triage at this time.

## 2023-10-13 ENCOUNTER — Ambulatory Visit (INDEPENDENT_AMBULATORY_CARE_PROVIDER_SITE_OTHER): Admitting: Internal Medicine

## 2023-10-13 ENCOUNTER — Encounter: Payer: Self-pay | Admitting: Internal Medicine

## 2023-10-13 VITALS — BP 120/88 | HR 67 | Temp 98.8°F | Ht 67.0 in | Wt 140.0 lb

## 2023-10-13 DIAGNOSIS — E039 Hypothyroidism, unspecified: Secondary | ICD-10-CM

## 2023-10-13 LAB — CBC
HCT: 49.4 % — ABNORMAL HIGH (ref 36.0–46.0)
Hemoglobin: 16.3 g/dL — ABNORMAL HIGH (ref 12.0–15.0)
MCHC: 33 g/dL (ref 30.0–36.0)
MCV: 101.3 fl — ABNORMAL HIGH (ref 78.0–100.0)
Platelets: 253 K/uL (ref 150.0–400.0)
RBC: 4.88 Mil/uL (ref 3.87–5.11)
RDW: 13.6 % (ref 11.5–15.5)
WBC: 7.8 K/uL (ref 4.0–10.5)

## 2023-10-13 LAB — COMPREHENSIVE METABOLIC PANEL WITH GFR
ALT: 15 U/L (ref 0–35)
AST: 21 U/L (ref 0–37)
Albumin: 4.1 g/dL (ref 3.5–5.2)
Alkaline Phosphatase: 69 U/L (ref 39–117)
BUN: 14 mg/dL (ref 6–23)
CO2: 22 meq/L (ref 19–32)
Calcium: 9.3 mg/dL (ref 8.4–10.5)
Chloride: 107 meq/L (ref 96–112)
Creatinine, Ser: 0.85 mg/dL (ref 0.40–1.20)
GFR: 68.9 mL/min (ref >60.00)
Glucose, Bld: 90 mg/dL (ref 70–99)
Potassium: 4.1 meq/L (ref 3.5–5.1)
Sodium: 139 meq/L (ref 135–145)
Total Bilirubin: 1 mg/dL (ref 0.2–1.2)
Total Protein: 6.9 g/dL (ref 6.0–8.3)

## 2023-10-13 LAB — T4, FREE: Free T4: 1.28 ng/dL (ref 0.60–1.60)

## 2023-10-13 LAB — TSH: TSH: 0.81 u[IU]/mL (ref 0.35–5.50)

## 2023-10-13 NOTE — Progress Notes (Signed)
 Subjective:    Patient ID: Deborah Price, female    DOB: 07-04-1950, 73 y.o.   MRN: [id number]  HPI Here to review her thyroid  treatment  Got new manufacturer for levothyroxine  Had been on 137mcg for a long time Had palpitations so dose was lowered eventually to 100 Felt like she was moving slow so we went back up to 112 mcg (TSH was 8)  Has felt tired in the day--this goes back a long time Some shakiness in hands yesterday for the first time  Losing hair some--shower and combing  Did cut back on smoking and stopped Then lost 2 friends in a car accident--so restarted  Current Outpatient Medications on File Prior to Visit  Medication Sig Dispense Refill   levothyroxine  (SYNTHROID ) 112 MCG tablet Take 1 tablet (112 mcg total) by mouth daily. 90 tablet 3   MULTIPLE VITAMIN PO Take 1 tablet by mouth daily.     Cholecalciferol (VITAMIN D3) 50 MCG (2000 UT) TABS Take 1 tablet by mouth daily. (Patient not taking: Reported on 10/13/2023)     No current facility-administered medications on file prior to visit.    Allergies  Allergen Reactions   Bee Venom    Ciprofloxacin  Other (See Comments)    Possible tendon problems   Codeine Nausea And Vomiting   Penicillins Hives    Past Medical History:  Diagnosis Date   Allergy    Mitral valve prolapse    Thyroid  disease     Past Surgical History:  Procedure Laterality Date   ABDOMINAL HYSTERECTOMY  1996   COLONOSCOPY WITH PROPOFOL  N/A 03/22/2021   Procedure: COLONOSCOPY WITH PROPOFOL ;  Surgeon: Unk Corinn Skiff, MD;  Location: [facility] ENDOSCOPY;  Service: Gastroenterology;  Laterality: N/A;    Family History  Problem Relation Age of Onset   Hypertension Mother    Dementia Mother    Bladder Cancer Neg Hx    Kidney cancer Neg Hx    Heart disease Neg Hx    Cancer Neg Hx     Social History   Socioeconomic History   Marital status: Single    Spouse name: Not on file   Number of children: 0   Years of education: Not on  file   Highest education level: Master's degree (e.g., MA, MS, MEng, MEd, MSW, MBA)  Occupational History   Occupation: Emergency planning/management officer: [employer]    Comment: Adjunct and private  Tobacco Use   Smoking status: Every Day    Current packs/day: 0.50    Average packs/day: 0.5 packs/day for 38.7 years (19.3 ttl pk-yrs)    Types: Cigarettes    Start date: 02/05/1985    Passive exposure: Never   Smokeless tobacco: Never   Tobacco comments:    Smokes 1/2 PPD as of 11/19/2022.  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 will   [employer] Kerie Badger is health care POA   Would accept resuscitation attempts   Not sure about tube feeds   Social Drivers of Health   Financial Resource Strain: Low Risk  (10/12/2023)   Overall Financial Resource Strain (CARDIA)    Difficulty of Paying Living Expenses: Not  hard at all  Food Insecurity: No Food Insecurity (10/12/2023)   Hunger Vital Sign    Worried About Running Out of Food in the Last Year: Never true    Ran Out of Food in the Last Year: Never true  Transportation Needs: No Transportation Needs (10/12/2023)   PRAPARE - Administrator, Civil Service (Medical): No    Lack of Transportation (Non-Medical): No  Physical Activity: Sufficiently Active (10/12/2023)   Exercise Vital Sign    Days of Exercise per Week: 5 days    Minutes of Exercise per Session: 60 min  Stress: No Stress Concern Present (10/12/2023)   [employer] of Occupational Health - Occupational Stress Questionnaire    Feeling of Stress: Only a little  Social Connections: Moderately Isolated (10/12/2023)   Social Connection and Isolation Panel    Frequency of Communication with Friends and Family: Once a  week    Frequency of Social Gatherings with Friends and Family: Once a week    Attends Religious Services: More than 4 times per year    Active Member of [employer] or Organizations: Yes    Attends Engineer, structural: More than 4 times per year    Marital Status: Never married  Intimate Partner Violence: Not At Risk (05/07/2018)   Humiliation, Afraid, Rape, and Kick questionnaire    Fear of Current or Ex-Partner: No    Emotionally Abused: No    Physically Abused: No    Sexually Abused: No   Review of Systems Appetite okay---but avoiding solid food with mouth issues Oral cyst biopsy was benign--but delayed healing after and soreness Then dentist able to pull the abnormal bone out---and things got better (and it healed up finally) Weight fairly stable    Objective:   Physical Exam Constitutional:      Appearance: Normal appearance.  Neurological:     Mental Status: She is alert.  Psychiatric:        Mood and Affect: Mood normal.        Behavior: Behavior normal.            Assessment & Plan:

## 2023-10-13 NOTE — Assessment & Plan Note (Signed)
 Has some fatigue but might not be related to the dose changes Did clearly have too high a free T4---but hopefully the 112 dose is okay Did have stress with oral cyst and persistent problems ---so this may have been more of an issue Will recheck the labs

## 2023-10-14 ENCOUNTER — Ambulatory Visit: Payer: Self-pay | Admitting: Internal Medicine

## 2023-10-19 ENCOUNTER — Telehealth: Payer: Self-pay

## 2023-10-19 NOTE — Telephone Encounter (Signed)
 Copied from CRM [id number]. Topic: Clinical - Lab/Test Results >> Oct 19, 2023 12:21 PM Rilla B wrote: Reason for CRM: Reason for CRM: Patient was under the impression she was to have lung screening in one year, however, noticed on mychart there is a concern about a GG nodule and Karna was scheduling her for June 2025.  Patient has questions and needs help understanding when she is to have the screen done... please call you.     ----------------------------------------------------------------------- From previous Reason for Contact - Lab/Test Order Request: Reason for CRM: Patient was under the impression she was to have lung screening in one year, however, noticed on mychart there is a concern about a GG nodule. Patient has questions and needs help understanding when she is to have the screen... please call you.    Spoke with patient let her know it just needs to be schedule someone will reach out to her for that    NFN-

## 2023-11-05 ENCOUNTER — Ambulatory Visit
Admission: RE | Admit: 2023-11-05 | Discharge: 2023-11-05 | Disposition: A | Discharge status: Home / Self Care | Source: Ambulatory Visit | Attending: Acute Care | Admitting: Acute Care

## 2023-11-05 DIAGNOSIS — Z87891 Personal history of nicotine dependence: Secondary | ICD-10-CM | POA: Diagnosis not present

## 2023-11-05 DIAGNOSIS — R911 Solitary pulmonary nodule: Secondary | ICD-10-CM | POA: Insufficient documentation

## 2023-11-23 ENCOUNTER — Other Ambulatory Visit: Payer: Self-pay | Admitting: Acute Care

## 2023-11-23 DIAGNOSIS — F17210 Nicotine dependence, cigarettes, uncomplicated: Secondary | ICD-10-CM

## 2023-11-23 DIAGNOSIS — Z122 Encounter for screening for malignant neoplasm of respiratory organs: Secondary | ICD-10-CM

## 2023-11-23 DIAGNOSIS — Z87891 Personal history of nicotine dependence: Secondary | ICD-10-CM

## 2023-12-03 ENCOUNTER — Ambulatory Visit: Admitting: Family Medicine

## 2023-12-03 ENCOUNTER — Ambulatory Visit: Payer: Self-pay

## 2023-12-03 ENCOUNTER — Encounter: Payer: Self-pay | Admitting: Family Medicine

## 2023-12-03 VITALS — BP 136/76 | HR 65 | Temp 98.3°F | Ht 67.0 in | Wt 140.0 lb

## 2023-12-03 DIAGNOSIS — T50B95A Adverse effect of other viral vaccines, initial encounter: Secondary | ICD-10-CM | POA: Diagnosis not present

## 2023-12-03 DIAGNOSIS — R5383 Other fatigue: Secondary | ICD-10-CM | POA: Diagnosis not present

## 2023-12-03 LAB — T3, FREE: T3, Free: 3.1 pg/mL (ref 2.3–4.2)

## 2023-12-03 LAB — T4, FREE: Free T4: 1.42 ng/dL (ref 0.60–1.60)

## 2023-12-03 LAB — TSH: TSH: 0.62 u[IU]/mL (ref 0.35–5.50)

## 2023-12-03 MED ORDER — BENZONATATE 200 MG PO CAPS: 200.0000 mg | ORAL_CAPSULE | Freq: Two times a day (BID) | ORAL | 0 refills | Status: DC | PRN

## 2023-12-03 NOTE — Progress Notes (Signed)
 Patient ID: Deborah Price, female    DOB: [birth year], 73 y.o.   MRN: [id number]  This visit was conducted in person.  BP 136/76   Pulse 65   Temp 98.3 F (36.8 C) (Oral)   Ht 5' 7 (1.702 m)   Wt 140 lb (63.5 kg)   SpO2 99%   BMI 21.93 kg/m    CC:  Chief Complaint  Patient presents with   Cough    C/o cough and chest/head congestion after getting Covid and flu vaccine 12/02/23.    Subjective:   HPI: Deborah Price is a 73 y.o. female presenting on 12/03/2023 for Cough (C/o cough and chest/head congestion after getting Covid and flu vaccine 12/02/23.)   Date of onset:  24 hours  Started after getting COVID and FLU vaccine yesterday.. started about 3 hours later  Was feeling well prior Initial symptoms included  throat clearing, nasal congestion, hoarse voice Symptoms progressed to chest congestion Mild low grade temp Headache, mild  Fatigue.  Coughing, productive  NO SOB, no wheeze.   Sick contacts: None COVID testing:   none     She has tried to treat with nothing     History of asthmatic bronchitis.... CT scan showed emphysema ., She is a smoker.       Relevant past medical, surgical, family and social history reviewed and updated as indicated. Interim medical history since our last visit reviewed. Allergies and medications reviewed and updated. Outpatient Medications Prior to Visit  Medication Sig Dispense Refill   Cholecalciferol (VITAMIN D3) 50 MCG (2000 UT) TABS Take 1 tablet by mouth daily.     levothyroxine  (SYNTHROID ) 112 MCG tablet Take 1 tablet (112 mcg total) by mouth daily. 90 tablet 3   MULTIPLE VITAMIN PO Take 1 tablet by mouth daily.     No facility-administered medications prior to visit.     Per HPI unless specifically indicated in ROS section below Review of Systems  Constitutional:  Negative for fatigue and fever.  HENT:  Positive for congestion and postnasal drip.   Eyes:  Negative for pain.  Respiratory:  Positive for cough.  Negative for shortness of breath.   Cardiovascular:  Negative for chest pain, palpitations and leg swelling.  Gastrointestinal:  Negative for abdominal pain.  Genitourinary:  Negative for dysuria and vaginal bleeding.  Musculoskeletal:  Negative for back pain.  Neurological:  Negative for syncope, light-headedness and headaches.  Psychiatric/Behavioral:  Negative for dysphoric mood.    Objective:  BP 136/76   Pulse 65   Temp 98.3 F (36.8 C) (Oral)   Ht 5' 7 (1.702 m)   Wt 140 lb (63.5 kg)   SpO2 99%   BMI 21.93 kg/m   Wt Readings from Last 3 Encounters:  12/03/23 140 lb (63.5 kg)  10/13/23 140 lb (63.5 kg)  02/17/23 138 lb (62.6 kg)      Physical Exam Constitutional:      General: She is not in acute distress.    Appearance: She is well-developed. She is not ill-appearing or toxic-appearing.  HENT:     Head: Normocephalic.     Right Ear: Hearing, tympanic membrane, ear canal and external ear normal. Tympanic membrane is not erythematous, retracted or bulging.     Left Ear: Hearing, tympanic membrane, ear canal and external ear normal. Tympanic membrane is not erythematous, retracted or bulging.     Nose: Mucosal edema and rhinorrhea present.     Right Sinus: No maxillary sinus tenderness  or frontal sinus tenderness.     Left Sinus: No maxillary sinus tenderness or frontal sinus tenderness.     Mouth/Throat:     Pharynx: Uvula midline.  Eyes:     General: Lids are normal. Lids are everted, no foreign bodies appreciated.     Conjunctiva/sclera: Conjunctivae normal.     Pupils: Pupils are equal, round, and reactive to light.  Neck:     Thyroid : No thyroid  mass or thyromegaly.     Vascular: No carotid bruit.     Trachea: Trachea normal.  Cardiovascular:     Rate and Rhythm: Normal rate and regular rhythm.     Pulses: Normal pulses.     Heart sounds: Normal heart sounds, S1 normal and S2 normal. No murmur heard.    No friction rub. No gallop.  Pulmonary:     Effort:  Pulmonary effort is normal. No tachypnea or respiratory distress.     Breath sounds: Normal breath sounds. No decreased breath sounds, wheezing, rhonchi or rales.  Musculoskeletal:     Cervical back: Normal range of motion and neck supple.  Skin:    General: Skin is warm and dry.     Findings: No rash.  Neurological:     Mental Status: She is alert.  Psychiatric:        Mood and Affect: Mood is not anxious or depressed.        Speech: Speech normal.        Behavior: Behavior normal. Behavior is cooperative.        Judgment: Judgment normal.       Results for orders placed or performed in visit on 12/03/23  TSH   Collection Time: 12/03/23 12:48 PM  Result Value Ref Range   TSH 0.62 0.35 - 5.50 uIU/mL  T3, Free   Collection Time: 12/03/23 12:48 PM  Result Value Ref Range   T3, Free 3.1 2.3 - 4.2 pg/mL  T4, free   Collection Time: 12/03/23 12:48 PM  Result Value Ref Range   Free T4 1.42 0.60 - 1.60 ng/dL    Assessment and Plan  Other fatigue Assessment & Plan: Given fatigue patient requests repeat thyroid  testing today.  Currently on levothyroxine  112 mcg daily  Orders: -     TSH -     T3, free -     T4, free  Adverse effect of influenza vaccine, initial encounter Assessment & Plan: Acute, following both COVID and flu vaccine.  Symptoms are improving significantly. She can use antihistamine, rest and fluids.  Prescription provided for benzonatate  to help with cough as needed.  No additional treatment needed.   Other orders -     Benzonatate ; Take 1 capsule (200 mg total) by mouth 2 (two) times daily as needed for cough.  Dispense: 20 capsule; Refill: 0    Return in about 6 weeks (around 01/14/2024) for  Pt would like to TOC to change to me  as PCP.SABRA in my next available.   Greig Ring, MD

## 2023-12-03 NOTE — Telephone Encounter (Signed)
 FYI Only or Action Required?: FYI only for provider.  Patient was last seen in primary care on 10/13/2023 by Deborah Charlie FERNS, MD.  Called Nurse Triage reporting Allergic Reaction.  Symptoms began yesterday.  Interventions attempted: Nothing.  Symptoms are: gradually improving.  Triage Disposition: See Physician Within 24 Hours (overriding Home Care)  Patient/caregiver understands and will follow disposition?: Yes       Copied from CRM [id number]. Topic: Clinical - Red Word Triage >> Dec 03, 2023  8:30 AM Franky GRADE wrote: Red Word that prompted transfer to Nurse Triage: Patient had both the Flu and Covid Vaccine done yesterday, per patient at night she developed a fever,sore throat, head and chest congestion and does not know if this is a reaction to any of the vaccines. Reason for Disposition  Influenza (Injection; Quadrivalent or Trivalent) injected vaccine reactions  Answer Assessment - Initial Assessment Questions 1. SYMPTOMS: What is the main symptom? (e.g., pain, redness, or swelling at injection site; feeling tired, fever, muscle aches)      Congestion nasal and chest , sore throat,  2. ONSET: When was the vaccine (shot) given? How much later did the that afternoon begin? (e.g., hours, days ago)      Yesterday  3. SEVERITY: How bad is it?      Sx started suddenly and developed  4. FEVER: Do you have a fever? If Yes, ask: What is your temperature, how was it measured, and when did it start?      Last night 99.5 5. IMMUNIZATIONS GIVEN: What shots have you recently received?     Got flu and covid vaccinations yesterday  6. PAST REACTIONS: Have you reacted to immunizations before? If Yes, ask: What happened?     no 7. OTHER SYMPTOMS: Do you have any other symptoms?     no  Protocols used: Immunization Reactions-A-AH

## 2023-12-08 ENCOUNTER — Ambulatory Visit: Payer: Self-pay | Admitting: Family Medicine

## 2023-12-17 DIAGNOSIS — T50B95A Adverse effect of other viral vaccines, initial encounter: Secondary | ICD-10-CM | POA: Insufficient documentation

## 2023-12-17 NOTE — Assessment & Plan Note (Signed)
 Given fatigue patient requests repeat thyroid  testing today.  Currently on levothyroxine  112 mcg daily

## 2023-12-17 NOTE — Assessment & Plan Note (Signed)
 Acute, following both COVID and flu vaccine.  Symptoms are improving significantly. She can use antihistamine, rest and fluids.  Prescription provided for benzonatate  to help with cough as needed.  No additional treatment needed.

## 2024-01-14 ENCOUNTER — Ambulatory Visit (INDEPENDENT_AMBULATORY_CARE_PROVIDER_SITE_OTHER): Admitting: Family Medicine

## 2024-01-14 ENCOUNTER — Encounter: Payer: Self-pay | Admitting: Family Medicine

## 2024-01-14 VITALS — BP 130/72 | HR 64 | Temp 98.3°F | Ht 67.0 in | Wt 140.2 lb

## 2024-01-14 DIAGNOSIS — E559 Vitamin D deficiency, unspecified: Secondary | ICD-10-CM

## 2024-01-14 DIAGNOSIS — J301 Allergic rhinitis due to pollen: Secondary | ICD-10-CM | POA: Diagnosis not present

## 2024-01-14 DIAGNOSIS — F17210 Nicotine dependence, cigarettes, uncomplicated: Secondary | ICD-10-CM | POA: Diagnosis not present

## 2024-01-14 DIAGNOSIS — R5383 Other fatigue: Secondary | ICD-10-CM

## 2024-01-14 DIAGNOSIS — J309 Allergic rhinitis, unspecified: Secondary | ICD-10-CM | POA: Insufficient documentation

## 2024-01-14 DIAGNOSIS — I471 Supraventricular tachycardia, unspecified: Secondary | ICD-10-CM

## 2024-01-14 DIAGNOSIS — Z13220 Encounter for screening for lipoid disorders: Secondary | ICD-10-CM

## 2024-01-14 DIAGNOSIS — E039 Hypothyroidism, unspecified: Secondary | ICD-10-CM

## 2024-01-14 MED ORDER — LEVOTHYROXINE SODIUM 125 MCG PO TABS: 125.0000 ug | ORAL_TABLET | Freq: Every day | ORAL | 11 refills | Status: AC

## 2024-01-14 NOTE — Assessment & Plan Note (Signed)
 Plans to wean down amounts and work on behavioral methods.  Discussed  medication options.

## 2024-01-14 NOTE — Progress Notes (Signed)
 Patient ID: Deborah Price, female    DOB: [birth year], 73 y.o.   MRN: [id number]  This visit was conducted in person.  BP 130/72   Pulse 64   Temp 98.3 F (36.8 C) (Oral)   Ht 5' 7 (1.702 m)   Wt 140 lb 4 oz (63.6 kg)   SpO2 97%   BMI 21.97 kg/m    CC:  Chief Complaint  Patient presents with   Transitions Of Care    TOC from Dr Deborah Price.    Subjective:   HPI: Deborah Price is a 73 y.o. female presenting on 01/14/2024 for Transitions Of Care (TOC from Dr Deborah Price.)  Previous PCP: Deborah Price Last CPE: 02/2023  She continues to have some fatigue.   Hypothyroidism: Stable control on levothyroxine  112 mcg daily Lab Results  Component Value Date   TSH 0.62 12/03/2023   Vitamin D  deficiency on supplement: Last checked 2023  Tobacco abuse:  She is interested in quitting smoking.. smokes 1/4 to half pack a day. Has smoked for  40 years, but not heavy.  None during the day.. only in AM and PM.  SVT:   Felt was  due to   overtreated  thyroid .   No mood issues.   Relevant past medical, surgical, family and social history reviewed and updated as indicated. Interim medical history since our last visit reviewed. Allergies and medications reviewed and updated. Outpatient Medications Prior to Visit  Medication Sig Dispense Refill   Cholecalciferol (VITAMIN D3) 50 MCG (2000 UT) TABS Take 1 tablet by mouth daily.     MULTIPLE VITAMIN PO Take 1 tablet by mouth daily.     levothyroxine  (SYNTHROID ) 112 MCG tablet Take 1 tablet (112 mcg total) by mouth daily. 90 tablet 3   benzonatate  (TESSALON ) 200 MG capsule Take 1 capsule (200 mg total) by mouth 2 (two) times daily as needed for cough. 20 capsule 0   No facility-administered medications prior to visit.     Per HPI unless specifically indicated in ROS section below Review of Systems  Constitutional:  Negative for fatigue and fever.  HENT:  Negative for congestion.   Eyes:  Negative for pain.  Respiratory:  Negative for cough and  shortness of breath.   Cardiovascular:  Negative for chest pain, palpitations and leg swelling.  Gastrointestinal:  Negative for abdominal pain.  Genitourinary:  Negative for dysuria and vaginal bleeding.  Musculoskeletal:  Negative for back pain.  Neurological:  Negative for syncope, light-headedness and headaches.  Psychiatric/Behavioral:  Negative for dysphoric mood.    Objective:  BP 130/72   Pulse 64   Temp 98.3 F (36.8 C) (Oral)   Ht 5' 7 (1.702 m)   Wt 140 lb 4 oz (63.6 kg)   SpO2 97%   BMI 21.97 kg/m   Wt Readings from Last 3 Encounters:  01/14/24 140 lb 4 oz (63.6 kg)  12/03/23 140 lb (63.5 kg)  10/13/23 140 lb (63.5 kg)      Physical Exam Constitutional:      General: She is not in acute distress.    Appearance: Normal appearance. She is well-developed. She is not ill-appearing or toxic-appearing.  HENT:     Head: Normocephalic.     Right Ear: Hearing, tympanic membrane, ear canal and external ear normal. Tympanic membrane is not erythematous, retracted or bulging.     Left Ear: Hearing, tympanic membrane, ear canal and external ear normal. Tympanic membrane is not erythematous, retracted or bulging.  Nose: No mucosal edema or rhinorrhea.     Right Sinus: No maxillary sinus tenderness or frontal sinus tenderness.     Left Sinus: No maxillary sinus tenderness or frontal sinus tenderness.     Mouth/Throat:     Pharynx: Uvula midline.  Eyes:     General: Lids are normal. Lids are everted, no foreign bodies appreciated.     Conjunctiva/sclera: Conjunctivae normal.     Pupils: Pupils are equal, round, and reactive to light.  Neck:     Thyroid : No thyroid  mass or thyromegaly.     Vascular: No carotid bruit.     Trachea: Trachea normal.  Cardiovascular:     Rate and Rhythm: Normal rate and regular rhythm.     Pulses: Normal pulses.     Heart sounds: Normal heart sounds, S1 normal and S2 normal. No murmur heard.    No friction rub. No gallop.  Pulmonary:      Effort: Pulmonary effort is normal. No tachypnea or respiratory distress.     Breath sounds: Normal breath sounds. No decreased breath sounds, wheezing, rhonchi or rales.  Abdominal:     General: Bowel sounds are normal.     Palpations: Abdomen is soft.     Tenderness: There is no abdominal tenderness.  Musculoskeletal:     Cervical back: Normal range of motion and neck supple.  Skin:    General: Skin is warm and dry.     Findings: No rash.  Neurological:     Mental Status: She is alert.  Psychiatric:        Mood and Affect: Mood is not anxious or depressed.        Speech: Speech normal.        Behavior: Behavior normal. Behavior is cooperative.        Thought Content: Thought content normal.        Judgment: Judgment normal.       Results for orders placed or performed in visit on 12/03/23  TSH   Collection Time: 12/03/23 12:48 PM  Result Value Ref Range   TSH 0.62 0.35 - 5.50 uIU/mL  T3, Free   Collection Time: 12/03/23 12:48 PM  Result Value Ref Range   T3, Free 3.1 2.3 - 4.2 pg/mL  T4, free   Collection Time: 12/03/23 12:48 PM  Result Value Ref Range   Free T4 1.42 0.60 - 1.60 ng/dL    Assessment and Plan  Hypothyroidism, unspecified type Assessment & Plan: Chronic, stable control but she is feeling fatigued since lower dose.   Will try to boost dose some and recheck in 6-8 weeks.   Levothyroxine  125 mcg daily  Orders: -     TSH; Future -     T4, free; Future -     T3, free; Future  Vitamin D  deficiency Assessment & Plan: Vitamin D  deficiency on supplement: Last checked 2023  Orders: -     VITAMIN D  25 Hydroxy (Vit-D Deficiency, Fractures); Future  SVT (supraventricular tachycardia) Assessment & Plan: No symptoms now Likely from too much levothyroxine  or  manufacture  fluctuation.   Screening cholesterol level -     Comprehensive metabolic panel with GFR; Future -     Lipid panel; Future  Cigarette smoker Assessment & Plan:  Plans to wean  down amounts and work on behavioral methods.  Discussed  medication options.   Seasonal allergic rhinitis due to pollen  Other fatigue Assessment & Plan: Chronic, she feels this has been ongoing since thyroid   medication lowered.  Will also check vitamin D  level.  Allergies could be related to fatigue. Will try slight increase dose but not as high (137 mcg) as it was when she had an elevated TSH and palpitations.     Other orders -     Levothyroxine  Sodium; Take 1 tablet (125 mcg total) by mouth daily.  Dispense: 30 tablet; Refill: 11    Return in about 6 weeks (around 02/25/2024) for  AMW with me   with fasting labs prior.   Greig Ring, MD

## 2024-01-14 NOTE — Assessment & Plan Note (Addendum)
 Chronic, she feels this has been ongoing since thyroid  medication lowered.  Will also check vitamin D  level.  Allergies could be related to fatigue. Will try slight increase dose but not as high (137 mcg) as it was when she had an elevated TSH and palpitations.

## 2024-01-14 NOTE — Assessment & Plan Note (Signed)
 Vitamin D  deficiency on supplement: Last checked 2023

## 2024-01-14 NOTE — Assessment & Plan Note (Addendum)
 Chronic, stable control but she is feeling fatigued since lower dose.   Will try to boost dose some and recheck in 6-8 weeks.   Levothyroxine  125 mcg daily

## 2024-01-14 NOTE — Assessment & Plan Note (Signed)
 No symptoms now Likely from too much levothyroxine  or  manufacture  fluctuation.

## 2024-02-22 ENCOUNTER — Ambulatory Visit: Payer: Self-pay | Admitting: Family Medicine

## 2024-02-22 ENCOUNTER — Other Ambulatory Visit (INDEPENDENT_AMBULATORY_CARE_PROVIDER_SITE_OTHER)

## 2024-02-22 DIAGNOSIS — Z13220 Encounter for screening for lipoid disorders: Secondary | ICD-10-CM | POA: Diagnosis not present

## 2024-02-22 DIAGNOSIS — E039 Hypothyroidism, unspecified: Secondary | ICD-10-CM

## 2024-02-22 DIAGNOSIS — E559 Vitamin D deficiency, unspecified: Secondary | ICD-10-CM | POA: Diagnosis not present

## 2024-02-22 LAB — COMPREHENSIVE METABOLIC PANEL WITH GFR
ALT: 11 U/L (ref 0–35)
AST: 17 U/L (ref 0–37)
Albumin: 3.9 g/dL (ref 3.5–5.2)
Alkaline Phosphatase: 65 U/L (ref 39–117)
BUN: 13 mg/dL (ref 6–23)
CO2: 29 meq/L (ref 19–32)
Calcium: 9.7 mg/dL (ref 8.4–10.5)
Chloride: 104 meq/L (ref 96–112)
Creatinine, Ser: 0.75 mg/dL (ref 0.40–1.20)
GFR: 79.86 mL/min (ref >60.00)
Glucose, Bld: 96 mg/dL (ref 70–99)
Potassium: 4.2 meq/L (ref 3.5–5.1)
Sodium: 140 meq/L (ref 135–145)
Total Bilirubin: 0.5 mg/dL (ref 0.2–1.2)
Total Protein: 6.8 g/dL (ref 6.0–8.3)

## 2024-02-22 LAB — LIPID PANEL
Cholesterol: 193 mg/dL (ref 0–200)
HDL: 45.7 mg/dL (ref >39.00)
LDL Cholesterol: 121 mg/dL — ABNORMAL HIGH (ref 0–99)
NonHDL: 146.95
Total CHOL/HDL Ratio: 4
Triglycerides: 128 mg/dL (ref 0.0–149.0)
VLDL: 25.6 mg/dL (ref 0.0–40.0)

## 2024-02-22 LAB — TSH: TSH: 0.58 u[IU]/mL (ref 0.35–5.50)

## 2024-02-22 LAB — VITAMIN D 25 HYDROXY (VIT D DEFICIENCY, FRACTURES): VITD: 27.29 ng/mL — ABNORMAL LOW (ref 30.00–100.00)

## 2024-02-22 LAB — T3, FREE: T3, Free: 3.3 pg/mL (ref 2.3–4.2)

## 2024-02-22 LAB — T4, FREE: Free T4: 1.32 ng/dL (ref 0.60–1.60)

## 2024-02-22 NOTE — Progress Notes (Signed)
 No critical labs need to be addressed urgently. We will discuss labs in detail at upcoming office visit.

## 2024-02-26 ENCOUNTER — Encounter: Payer: Medicare HMO | Admitting: Family

## 2024-02-29 ENCOUNTER — Encounter: Payer: Medicare HMO | Admitting: Family

## 2024-03-01 ENCOUNTER — Encounter: Payer: Self-pay | Admitting: Family Medicine

## 2024-03-01 ENCOUNTER — Ambulatory Visit: Admitting: Family Medicine

## 2024-03-01 VITALS — BP 94/74 | HR 86 | Temp 98.2°F | Ht 67.5 in | Wt 136.2 lb

## 2024-03-01 DIAGNOSIS — I471 Supraventricular tachycardia, unspecified: Secondary | ICD-10-CM

## 2024-03-01 DIAGNOSIS — E7800 Pure hypercholesterolemia, unspecified: Secondary | ICD-10-CM | POA: Diagnosis not present

## 2024-03-01 DIAGNOSIS — F17210 Nicotine dependence, cigarettes, uncomplicated: Secondary | ICD-10-CM | POA: Diagnosis not present

## 2024-03-01 DIAGNOSIS — E559 Vitamin D deficiency, unspecified: Secondary | ICD-10-CM | POA: Diagnosis not present

## 2024-03-01 DIAGNOSIS — Z0000 Encounter for general adult medical examination without abnormal findings: Secondary | ICD-10-CM | POA: Diagnosis not present

## 2024-03-01 DIAGNOSIS — E039 Hypothyroidism, unspecified: Secondary | ICD-10-CM

## 2024-03-01 DIAGNOSIS — E2839 Other primary ovarian failure: Secondary | ICD-10-CM | POA: Diagnosis not present

## 2024-03-01 DIAGNOSIS — R5383 Other fatigue: Secondary | ICD-10-CM

## 2024-03-01 DIAGNOSIS — I700 Atherosclerosis of aorta: Secondary | ICD-10-CM | POA: Insufficient documentation

## 2024-03-01 DIAGNOSIS — J439 Emphysema, unspecified: Secondary | ICD-10-CM | POA: Diagnosis not present

## 2024-03-01 MED ORDER — VITAMIN D3 1.25 MG (50000 UT) PO CAPS: 1.0000 | ORAL_CAPSULE | ORAL | 0 refills | Status: AC

## 2024-03-01 NOTE — Assessment & Plan Note (Signed)
 Low normal

## 2024-03-01 NOTE — Patient Instructions (Addendum)
 Please call the location of your choice from the menu below to schedule your Mammogram and Bone Density appointment.     [place] [facility]   Phone:  [phone]   [address]                                                                            [place], [place] [address]                                            Services: 3D Mammogram and Bone [facility])  Phone:  [phone]   [address]                        [place], [place] [address]                                              Services:  3D Mammogram and Bone Density    Here is some info I have gathered for you for a trusted medical source. Lipid Management With Diet, Uptodate Feb 20.2021, [place] and [place]  Although earlier, smaller trials suggested a benefit of garlic supplementation, a subsequent larger trial failed to demonstrate improvement in lipids with use of any of three different garlic preparations (raw, powdered, or aged).  Bergamot: Improvements in serum lipids have been reported in trials of patients with metabolic syndrome, nonalcoholic fatty liver disease and in hyperlipidemic patients resistant to statin treatment. However, high-quality data on the effects of bergamot are lacking.  Suggestions for you if you would like to try natural supplements to lower cholesterol.  1.Souble fiber : Psyllium In a meta-analysis of randomized trials of patients with both normal and elevated cholesterol levels, the addition of 10.2 g/day of psyllium lowered the LDL cholesterol by an average of 12.8 mg/dL   2. Omega 3s: Mixed results in studies. Given you triglycerides are normal I would not use this.  3.Red yeast rice ( 2.4 grams divided half in AM half in PM): Red yeast rice is a fermented rice product, most often taken as a supplement, which can improve serum cholesterol  via   method similar to prescription statins.  Red yeast rice supplements lowered total cholesterol (208 versus 251 mg/dL) and LDL cholesterol (864 versus 175 mg/dL) compared with placebo.  4. Plant sterol.. There are naturally occurring sterols and stanols in nuts, legumes, whole grains, fruits, vegetables, and plant oils. In addition, a number of manufactured products enriched with plant sterols and stanols are commercially available. The margarines containing these compounds (eg, Benecol and Take Control spreads) have been available the longest and are the most studied  In a trial of 150 patients with mild hypercholesterolemia,those consuming the fortified margarine experienced a 10 to 14 percent decrease in total cholesterol and LDL cholesterol.  5.Green Tea Catechins: .n a year-long randomized trial  of more than 900 healthy postmenopausal women, green tea catechin supplements (1315 mg catechins/day) reduced total cholesterol and LDL cholesterol, increased triglycerides, and had no effect on HDL cholesterol

## 2024-03-01 NOTE — Assessment & Plan Note (Addendum)
 Chronic, stable control .SABRA More energy on higher dose  Levothyroxine  125 mcg daily

## 2024-03-01 NOTE — Progress Notes (Signed)
 Patient ID: Deborah Price, female    DOB: [birth year], 73 y.o.   MRN: [id number]  This visit was conducted in person.  BP 94/74   Pulse 86   Temp 98.2 F (36.8 C) (Temporal)   Ht 5' 7.5 (1.715 m)   Wt 136 lb 4 oz (61.8 kg)   SpO2 96%   BMI 21.02 kg/m    CC:  Chief Complaint  Patient presents with   Medicare Wellness    Subjective:   HPI: Deborah Price is a 73 y.o. female presenting on 03/01/2024 for Medicare Wellness  The patient presents for annual medicare wellness, complete physical and review of chronic health problems. He/She also has the following acute concerns today:  I have personally reviewed the Medicare Annual Wellness questionnaire and have noted 1. The patient's medical and social history 2. Their use of alcohol, tobacco or illicit drugs 3. Their current medications and supplements 4. The patient's functional ability including ADL's, fall risks, home safety risks and hearing or visual             impairment. 5. Diet and physical activities 6. Evidence for depression or mood disorders 7.         Updated provider list Cognitive evaluation was performed and recorded on pt medicare questionnaire form. The patients weight, height, BMI and visual acuity have been recorded in the chart   I have made referrals, counseling and provided education to the patient based review of the above and I have provided the pt with a written personalized care plan for preventive services.   Documentation of this information was scanned into the electronic record under the media tab.   Advance directives and end of life planning reviewed in detail with patient and documented in EMR. Patient given handout on advance care directives if needed. HCPOA and living will updated if needed.  Hearing Screening  Method: Audiometry   500Hz  1000Hz  2000Hz  3000Hz  4000Hz  6000Hz  8000Hz   Right ear 20 20 20 20 20  40 40  Left ear 35 35 35 40 35 35 35  Vision Screening - Comments:: Wears  Glasses-Eye Exam at [facility] 04/17/2022   No falls in last 12 months.    Hypothyroidism: Stable control on levothyroxine    125 mcg daily.. maybe having more hair growth.. still feeling tired. Lab Results  Component Value Date   TSH 0.58 02/22/2024   Vitamin D  deficiency low normal.. has started  2000 international  units daily  Tobacco abuse:  She is interested in quitting smoking.. smokes 1/4 to half pack a day. Has smoked for  40 years, but not heavy.  None during the day.. only in AM and PM.  SVT:   Felt was  due to   overtreated  thyroid .   Elevated Cholesterol:  Worsened control Lab Results  Component Value Date   CHOL 193 02/22/2024   HDL 45.70 02/22/2024   LDLCALC 121 (H) 02/22/2024   TRIG 128.0 02/22/2024   CHOLHDL 4 02/22/2024   The 10-year ASCVD risk score (Arnett DK, et al., 2019) is: 9.5%   Values used to calculate the score:     Age: 62 years     Clinically relevant sex: Female     Is Non-Hispanic African American: No     Diabetic: No     Tobacco smoker: Yes     Systolic Blood Pressure: 94 mmHg     Is BP treated: No     HDL Cholesterol: 45.7 mg/dL  Total Cholesterol: 193 mg/dL Using medications without problems: Muscle aches:  Diet compliance: Exercise: daily Other complaints:    No mood issues. Flowsheet Row Office Visit from 03/01/2024 in [facility]  PHQ-2 Total Score 0     Relevant past medical, surgical, family and social history reviewed and updated as indicated. Interim medical history since our last visit reviewed. Allergies and medications reviewed and updated. Outpatient Medications Prior to Visit  Medication Sig Dispense Refill   acetaminophen (TYLENOL) 500 MG tablet Take 500 mg by mouth every 6 (six) hours as needed.     Cyanocobalamin  (VITAMIN B 12) 500 MCG TABS Take 1 tablet by mouth daily.     levothyroxine  (SYNTHROID ) 125 MCG tablet Take 1 tablet (125 mcg total) by mouth daily. 30 tablet  11   MULTIPLE VITAMIN PO Take 1 tablet by mouth daily.     Cholecalciferol (VITAMIN D3) 50 MCG (2000 UT) TABS Take 1 tablet by mouth daily.     No facility-administered medications prior to visit.     Per HPI unless specifically indicated in ROS section below Review of Systems  Constitutional:  Negative for fatigue and fever.  HENT:  Negative for congestion.   Eyes:  Negative for pain.  Respiratory:  Negative for cough and shortness of breath.   Cardiovascular:  Negative for chest pain, palpitations and leg swelling.  Gastrointestinal:  Negative for abdominal pain.  Genitourinary:  Negative for dysuria and vaginal bleeding.  Musculoskeletal:  Negative for back pain.  Neurological:  Negative for syncope, light-headedness and headaches.  Psychiatric/Behavioral:  Negative for dysphoric mood.    Objective:  BP 94/74   Pulse 86   Temp 98.2 F (36.8 C) (Temporal)   Ht 5' 7.5 (1.715 m)   Wt 136 lb 4 oz (61.8 kg)   SpO2 96%   BMI 21.02 kg/m   Wt Readings from Last 3 Encounters:  03/01/24 136 lb 4 oz (61.8 kg)  01/14/24 140 lb 4 oz (63.6 kg)  12/03/23 140 lb (63.5 kg)      Physical Exam Constitutional:      General: She is not in acute distress.    Appearance: Normal appearance. She is well-developed. She is not ill-appearing or toxic-appearing.  HENT:     Head: Normocephalic.     Right Ear: Hearing, tympanic membrane, ear canal and external ear normal. Tympanic membrane is not erythematous, retracted or bulging.     Left Ear: Hearing, tympanic membrane, ear canal and external ear normal. Tympanic membrane is not erythematous, retracted or bulging.     Nose: No mucosal edema or rhinorrhea.     Right Sinus: No maxillary sinus tenderness or frontal sinus tenderness.     Left Sinus: No maxillary sinus tenderness or frontal sinus tenderness.     Mouth/Throat:     Pharynx: Uvula midline.  Eyes:     General: Lids are normal. Lids are everted, no foreign bodies appreciated.      Conjunctiva/sclera: Conjunctivae normal.     Pupils: Pupils are equal, round, and reactive to light.  Neck:     Thyroid : No thyroid  mass or thyromegaly.     Vascular: No carotid bruit.     Trachea: Trachea normal.  Cardiovascular:     Rate and Rhythm: Normal rate and regular rhythm.     Pulses: Normal pulses.     Heart sounds: Normal heart sounds, S1 normal and S2 normal. No murmur heard.    No friction rub. No  gallop.  Pulmonary:     Effort: Pulmonary effort is normal. No tachypnea or respiratory distress.     Breath sounds: Normal breath sounds. No decreased breath sounds, wheezing, rhonchi or rales.  Abdominal:     General: Bowel sounds are normal.     Palpations: Abdomen is soft.     Tenderness: There is no abdominal tenderness.  Musculoskeletal:     Cervical back: Normal range of motion and neck supple.  Skin:    General: Skin is warm and dry.     Findings: No rash.  Neurological:     Mental Status: She is alert.  Psychiatric:        Mood and Affect: Mood is not anxious or depressed.        Speech: Speech normal.        Behavior: Behavior normal. Behavior is cooperative.        Thought Content: Thought content normal.        Judgment: Judgment normal.       Results for orders placed or performed in visit on 02/22/24  Lipid panel   Collection Time: 02/22/24  8:09 AM  Result Value Ref Range   Cholesterol 193 0 - 200 mg/dL   Triglycerides 871.9 0.0 - 149.0 mg/dL   HDL 54.29 >60.99 mg/dL   VLDL 74.3 0.0 - 59.9 mg/dL   LDL Cholesterol 878 (H) 0 - 99 mg/dL   Total CHOL/HDL Ratio 4    NonHDL 146.95   Comprehensive metabolic panel with GFR   Collection Time: 02/22/24  8:09 AM  Result Value Ref Range   Sodium 140 135 - 145 mEq/L   Potassium 4.2 3.5 - 5.1 mEq/L   Chloride 104 96 - 112 mEq/L   CO2 29 19 - 32 mEq/L   Glucose, Bld 96 70 - 99 mg/dL   BUN 13 6 - 23 mg/dL   Creatinine, Ser 9.24 0.40 - 1.20 mg/dL   Total Bilirubin 0.5 0.2 - 1.2 mg/dL   Alkaline Phosphatase  65 39 - 117 U/L   AST 17 0 - 37 U/L   ALT 11 0 - 35 U/L   Total Protein 6.8 6.0 - 8.3 g/dL   Albumin 3.9 3.5 - 5.2 g/dL   GFR 20.13 >39.99 mL/min   Calcium  9.7 8.4 - 10.5 mg/dL  VITAMIN D  25 Hydroxy (Vit-D Deficiency, Fractures)   Collection Time: 02/22/24  8:09 AM  Result Value Ref Range   VITD 27.29 (L) 30.00 - 100.00 ng/mL  T3, Free   Collection Time: 02/22/24  8:09 AM  Result Value Ref Range   T3, Free 3.3 2.3 - 4.2 pg/mL  T4, free   Collection Time: 02/22/24  8:09 AM  Result Value Ref Range   Free T4 1.32 0.60 - 1.60 ng/dL  TSH   Collection Time: 02/22/24  8:09 AM  Result Value Ref Range   TSH 0.58 0.35 - 5.50 uIU/mL    Assessment and Plan The patient's preventative maintenance and recommended screening tests for an annual wellness exam were reviewed in full today. Brought up to date unless services declined.  Counselled on the importance of diet, exercise, and its role in overall health and mortality. The patient's FH and SH was reviewed, including their home life, tobacco status, and drug and alcohol status.   Vaccines: uptodate with flu, shingrix, PCV, TDap, COVID Pap/DVE:  not indicated Mammo:  due Bone Density: due Colon: 1//2023 repeat in 10 years Smoking Status: current ETOH/ drug use: none/none  Hep  C:  done   8/21/20225 lung cancer screening  Medicare annual wellness visit, subsequent  Hypothyroidism, unspecified type Assessment & Plan: Chronic, stable control .SABRA More energy on higher dose  Levothyroxine  125 mcg daily  Orders: -     TSH; Future -     T4, free; Future -     T3, free; Future  Vitamin D  deficiency Assessment & Plan:  Low normal   Cigarette smoker Assessment & Plan:  Plans to wean down amounts and work on behavioral methods.  Discussed  medication options. Has tried lozenges in past.  Looking into hypnosis.   SVT (supraventricular tachycardia) Assessment & Plan: No  current symptoms     High cholesterol -     Lipid  panel; Future -     Comprehensive metabolic panel with GFR; Future  Estrogen deficiency -     DG Bone Density; Future  Other fatigue -     Vitamin B12; Future  Emphysema lung (HCC) Assessment & Plan:  Noted on lung cancer CT screening.  Working on quitting smoking.   Aortic atherosclerosis  Other orders -     Vitamin D3; Take 1 capsule (1.25 mg total) by mouth once a week.  Dispense: 12 capsule; Refill: 0     Return in about 3 months (around 05/30/2024) for lab only.   Greig Ring, MD

## 2024-03-01 NOTE — Assessment & Plan Note (Signed)
 Noted on lung cancer CT screening.  Working on quitting smoking.

## 2024-03-01 NOTE — Assessment & Plan Note (Addendum)
 Plans to wean down amounts and work on behavioral methods.  Discussed  medication options. Has tried lozenges in past.  Looking into hypnosis.

## 2024-03-01 NOTE — Assessment & Plan Note (Signed)
 No current symptoms

## 2024-03-14 ENCOUNTER — Encounter: Payer: Self-pay | Admitting: Medical

## 2024-03-14 ENCOUNTER — Ambulatory Visit: Attending: Medical | Admitting: Medical

## 2024-03-14 VITALS — BP 130/90 | HR 71 | Ht 68.0 in | Wt 139.4 lb

## 2024-03-14 DIAGNOSIS — Z720 Tobacco use: Secondary | ICD-10-CM | POA: Diagnosis not present

## 2024-03-14 DIAGNOSIS — I471 Supraventricular tachycardia, unspecified: Secondary | ICD-10-CM

## 2024-03-14 DIAGNOSIS — E038 Other specified hypothyroidism: Secondary | ICD-10-CM

## 2024-03-14 NOTE — Patient Instructions (Signed)
 Medication Instructions:  Your physician recommends that you continue on your current medications as directed. Please refer to the Current Medication list given to you today.    *If you need a refill on your cardiac medications before your next appointment, please call your pharmacy*  Lab Work: No labs ordered today    Testing/Procedures: No test ordered today   Follow-Up: At [facility], you and your health needs are our priority.  As part of our continuing mission to provide you with exceptional heart care, our providers are all part of one team.  This team includes your primary Cardiologist (physician) and Advanced Practice Providers or APPs (Physician Assistants and Nurse Practitioners) who all work together to provide you with the care you need, when you need it.  Your next appointment:   1 year(s)  Provider:   Redell Cave, MD or Cadence Franchester, PA-C

## 2024-03-14 NOTE — Progress Notes (Signed)
" °  Cardiology Office Note   Date:  03/14/2024  ID:  Deborah Price, DOB 25-Oct-1950, MRN [id number] PCP: Avelina Greig BRAVO, MD  [facility] Providers Cardiologist:  Redell Cave, MD Electrophysiologist:  OLE ONEIDA HOLTS, MD  History of Present Illness Deborah Price is a 73 y.o. female with a history of SVT, current smoker, hypothyroidism, Vit D def who presents for follow-up.  Heart monitor in 05/2022 showed 1 episode of SVT lasting 9 beats.  No sustained arrhythmias.  Echocardiogram in 07/2022 showed EF of 60 to 65%.  She has a history of fatigue and hair loss with metoprolol .  She was last seen November 2024 and was stable from a cardiac perspective.  Today, the patient reports she has been doing well form a cardiac perspective. She has not had palpitations.  There has been a lot of discrepancy within the thyroid  medication companies.  She has now settled on 1 that she feels is stable.  She is taking levothyroxine  125 mcg daily.  She was started on Vitamin D  supplementation by PCP.     Studies Reviewed   Echo 07/2022  1. Left ventricular ejection fraction, by estimation, is 60 to 65%. The  left ventricle has normal function. The left ventricle has no regional  wall motion abnormalities. Left ventricular diastolic parameters are  indeterminate.   2. Right ventricular systolic function is normal. The right ventricular  size is normal.   3. The mitral valve is normal in structure. No evidence of mitral valve  regurgitation. No evidence of mitral stenosis.   4. The aortic valve is tricuspid. Aortic valve regurgitation is not  visualized. No aortic stenosis is present.   5. The inferior vena cava is normal in size with greater than 50%  respiratory variability, suggesting right atrial pressure of 3 mmHg.   Heart monitor 05/2022 Predominately NSR Min HR 46, max HR 190bpm, avg HR 69bpm.  1 run of SVT lasting 9 beats, rare ectopy  Physical Exam VS:  BP (!) 130/90 (BP Location:  Left Arm, Patient Position: Sitting, Cuff Size: Normal)   Pulse 71   Ht 5' 8 (1.727 m)   Wt 139 lb 6 oz (63.2 kg)   SpO2 96%   BMI 21.19 kg/m        Wt Readings from Last 3 Encounters:  03/14/24 139 lb 6 oz (63.2 kg)  03/01/24 136 lb 4 oz (61.8 kg)  01/14/24 140 lb 4 oz (63.6 kg)    GEN: Well nourished, well developed in no acute distress NECK: No JVD; No carotid bruits CARDIAC: RRR, no murmurs, rubs, gallops RESPIRATORY:  Clear to auscultation without rales, wheezing or rhonchi  ABDOMEN: Soft, non-tender, non-distended EXTREMITIES:  No edema; No deformity   ASSESSMENT AND PLAN  SVT Patient denies any palpitations. It was felt these were from difference in thyroid  medication companies. We will continue to monitor symptoms   Hypothyroidism She is taking levothyroxine  125mcg daily, monitor by PCP.   Tobacco use She is still smoking, cessation recommended.       Dispo: follow-up in 1 year  Signed, Aneta Hendershott VEAR Fishman, PA-C   "
